# Patient Record
Sex: Female | Born: 1965 | Race: Black or African American | Hispanic: No | Smoking: Former smoker
Health system: Southern US, Community
[De-identification: ages and names within clinical notes are randomized; demographics above are authoritative.]

## PROBLEM LIST (undated history)

## (undated) DIAGNOSIS — I10 Essential (primary) hypertension: Secondary | ICD-10-CM

## (undated) DIAGNOSIS — N183 Chronic kidney disease, stage 3 unspecified: Secondary | ICD-10-CM

---

## 2004-04-18 ENCOUNTER — Emergency Department: Payer: Self-pay | Admitting: Emergency Medicine

## 2007-02-26 ENCOUNTER — Other Ambulatory Visit: Payer: Self-pay

## 2007-02-26 ENCOUNTER — Emergency Department: Payer: Self-pay | Admitting: Internal Medicine

## 2008-08-08 ENCOUNTER — Ambulatory Visit: Payer: Self-pay | Admitting: Family Medicine

## 2008-12-15 ENCOUNTER — Ambulatory Visit: Payer: Self-pay | Admitting: Family Medicine

## 2009-11-16 ENCOUNTER — Ambulatory Visit: Payer: Self-pay | Admitting: Family Medicine

## 2011-02-05 ENCOUNTER — Ambulatory Visit: Payer: Self-pay | Admitting: Family Medicine

## 2012-05-21 ENCOUNTER — Ambulatory Visit: Payer: Self-pay | Admitting: Family Medicine

## 2013-10-18 ENCOUNTER — Ambulatory Visit: Payer: Self-pay | Admitting: Family Medicine

## 2015-10-09 ENCOUNTER — Other Ambulatory Visit: Payer: Self-pay | Admitting: Family Medicine

## 2015-10-09 DIAGNOSIS — Z1231 Encounter for screening mammogram for malignant neoplasm of breast: Secondary | ICD-10-CM

## 2015-10-25 ENCOUNTER — Ambulatory Visit
Admission: RE | Admit: 2015-10-25 | Discharge: 2015-10-25 | Disposition: A | Discharge status: Home / Self Care | Payer: Medicaid Other | Source: Ambulatory Visit | Attending: Family Medicine | Admitting: Family Medicine

## 2015-10-25 DIAGNOSIS — Z1231 Encounter for screening mammogram for malignant neoplasm of breast: Secondary | ICD-10-CM

## 2017-01-29 ENCOUNTER — Other Ambulatory Visit: Payer: Self-pay | Admitting: Family Medicine

## 2017-01-29 DIAGNOSIS — Z1239 Encounter for other screening for malignant neoplasm of breast: Secondary | ICD-10-CM

## 2017-02-24 ENCOUNTER — Ambulatory Visit
Admission: RE | Admit: 2017-02-24 | Discharge: 2017-02-24 | Disposition: A | Discharge status: Home / Self Care | Payer: Medicaid Other | Source: Ambulatory Visit | Attending: Family Medicine | Admitting: Family Medicine

## 2017-02-24 DIAGNOSIS — Z1231 Encounter for screening mammogram for malignant neoplasm of breast: Secondary | ICD-10-CM | POA: Insufficient documentation

## 2017-02-24 DIAGNOSIS — Z1239 Encounter for other screening for malignant neoplasm of breast: Secondary | ICD-10-CM

## 2018-09-20 ENCOUNTER — Emergency Department
Admission: EM | Admit: 2018-09-20 | Discharge: 2018-09-20 | Disposition: A | Discharge status: Home / Self Care | Payer: Medicaid Other | Attending: Emergency Medicine | Admitting: Emergency Medicine

## 2018-09-20 ENCOUNTER — Emergency Department: Payer: Medicaid Other

## 2018-09-20 ENCOUNTER — Other Ambulatory Visit: Payer: Self-pay

## 2018-09-20 DIAGNOSIS — I129 Hypertensive chronic kidney disease with stage 1 through stage 4 chronic kidney disease, or unspecified chronic kidney disease: Secondary | ICD-10-CM | POA: Insufficient documentation

## 2018-09-20 DIAGNOSIS — W01198A Fall on same level from slipping, tripping and stumbling with subsequent striking against other object, initial encounter: Secondary | ICD-10-CM | POA: Insufficient documentation

## 2018-09-20 DIAGNOSIS — Y929 Unspecified place or not applicable: Secondary | ICD-10-CM | POA: Insufficient documentation

## 2018-09-20 DIAGNOSIS — Y999 Unspecified external cause status: Secondary | ICD-10-CM | POA: Insufficient documentation

## 2018-09-20 DIAGNOSIS — Y9389 Activity, other specified: Secondary | ICD-10-CM | POA: Insufficient documentation

## 2018-09-20 DIAGNOSIS — S4992XA Unspecified injury of left shoulder and upper arm, initial encounter: Secondary | ICD-10-CM | POA: Diagnosis present

## 2018-09-20 DIAGNOSIS — R262 Difficulty in walking, not elsewhere classified: Secondary | ICD-10-CM | POA: Diagnosis not present

## 2018-09-20 DIAGNOSIS — N183 Chronic kidney disease, stage 3 (moderate): Secondary | ICD-10-CM | POA: Insufficient documentation

## 2018-09-20 DIAGNOSIS — W19XXXA Unspecified fall, initial encounter: Secondary | ICD-10-CM

## 2018-09-20 DIAGNOSIS — S40012A Contusion of left shoulder, initial encounter: Secondary | ICD-10-CM | POA: Insufficient documentation

## 2018-09-20 HISTORY — DX: Essential (primary) hypertension: I10

## 2018-09-20 HISTORY — DX: Chronic kidney disease, stage 3 unspecified: N18.30

## 2018-09-20 MED ORDER — ACETAMINOPHEN 500 MG PO TABS
1000.0000 mg | ORAL_TABLET | Freq: Once | ORAL | Status: AC
  Administered 2018-09-20: 1000 mg via ORAL
  Filled 2018-09-20: qty 2

## 2018-09-20 NOTE — ED Notes (Signed)
Pt may eat and drink per EDP. Pt given saltines and soda.

## 2018-09-20 NOTE — ED Triage Notes (Signed)
Pt states she fell trying to get to walker, walker slipped away and pt fell. Pt was trying to get up and pulled something in shoulder. Pt uses wheelchair. Pt denies LOC, lightheadedness. Pt denies hitting head.

## 2018-09-20 NOTE — Discharge Instructions (Signed)
You should be contacted by a home health agency by the end of the week.  In the meantime, attempt to minimize falls by using your walker and wheelchair and ask your son for assistance when needed.  Please follow-up with your primary care provider and return to the ED for new or worsening symptoms.

## 2018-09-20 NOTE — ED Provider Notes (Signed)
[facility] Emergency Department Provider Note   ____________________________________________   First MD Initiated Contact with Patient 09/20/18 1204     (approximate)  I have reviewed the triage vital signs and the nursing notes.   HISTORY  Chief Complaint Fall    HPI Karen Ray is a 53 y.o. female 53 year old female with past medical history of hypertension presents to the ED complaining of a fall.  Patient reports that she was attempting to transition from a seated position to her Hoveround wheelchair using the assistance of her walker, when the walker slipped and she fell to the ground.  She reports falling onto her left shoulder but denies hitting her head or losing consciousness.  She now complains of pain in her left shoulder but denies pain anywhere else.  Range of motion in her left shoulder is limited secondary to pain, but she is able to range her left elbow and left wrist without difficulty.  She states she lives alone and has only occasional help from her son at home.  She expresses concerns about her safety at home and ability to care for herself.        Past Medical History:  Diagnosis Date  . Hypertension   . Kidney disease, chronic, stage III (moderate, EGFR 30-59 ml/min) (HCC)     There are no active problems to display for this patient.   No past surgical history on file.  Prior to Admission medications   Not on File    Allergies Patient has no allergy information on record.  Family History  Problem Relation Age of Onset  . Breast cancer Mother     Social History Social History   Tobacco Use  . Smoking status: Not on file  Substance Use Topics  . Alcohol use: Not on file  . Drug use: Not on file    Review of Systems  Constitutional: No fever/chills Eyes: No visual changes. ENT: No sore throat. Cardiovascular: Denies chest pain. Respiratory: Denies shortness of breath. Gastrointestinal: No abdominal  pain.  No nausea, no vomiting.  No diarrhea.  No constipation. Genitourinary: Negative for dysuria. Musculoskeletal: Negative for back pain.  Positive for fall and left shoulder pain. Skin: Negative for rash. Neurological: Negative for headaches, focal weakness or numbness.  ____________________________________________   PHYSICAL EXAM:  VITAL SIGNS: ED Triage Vitals  Enc Vitals Group     BP 09/20/18 1109 (!) 154/81     Pulse Rate 09/20/18 1109 98     Resp 09/20/18 1109 18     Temp 09/20/18 1109 98.5 F (36.9 C)     Temp Source 09/20/18 1109 Oral     SpO2 09/20/18 1109 98 %     Weight 09/20/18 1111 220 lb (99.8 kg)     Height 09/20/18 1111 '5\' 3"'$  (1.6 m)     Head Circumference --      Peak Flow --      Pain Score 09/20/18 1110 10     Pain Loc --      Pain Edu? --      Excl. in [place]? --     Constitutional: Alert and oriented. Eyes: Conjunctivae are normal. Head: Atraumatic. Nose: No congestion/rhinnorhea. Mouth/Throat: Mucous membranes are moist. Neck: Normal ROM Cardiovascular: Normal rate, regular rhythm. Grossly normal heart sounds. Respiratory: Normal respiratory effort.  No retractions. Lungs CTAB. Gastrointestinal: Soft and nontender. No distention. Genitourinary: deferred Musculoskeletal: No lower extremity tenderness nor edema.  Diffuse tenderness to left lateral shoulder with range  of motion limited secondary to pain.  No bony tenderness along elbow or wrist, range of motion intact at these joints. Neurologic:  Normal speech and language. No gross focal neurologic deficits are appreciated. Skin:  Skin is warm, dry and intact. No rash noted. Psychiatric: Mood and affect are normal. Speech and behavior are normal.  ____________________________________________   LABS (all labs ordered are listed, but only abnormal results are displayed)  Labs Reviewed - No data to display   PROCEDURES  Procedure(s) performed (including Critical Care):  Procedures    ____________________________________________   INITIAL IMPRESSION / ASSESSMENT AND PLAN / ED COURSE       53 year old female presents to the ED after mechanical fall at home, striking her left shoulder.  She is neurovascularly intact to her left upper extremity and plain films negative for acute process.  No other apparent traumatic injuries, patient placed in shoulder immobilizer for comfort.  There is concerned that patient may not be able to care for herself at home, will discuss with care management.  Care management has arranged for patient to receive home health care hopefully starting later this week.  Patient is agreeable to this, has a safe ride home and her son is available for help as needed.      ____________________________________________   FINAL CLINICAL IMPRESSION(S) / ED DIAGNOSES  Final diagnoses:  Fall, initial encounter  Contusion of left shoulder, initial encounter  Difficulty walking     ED Discharge Orders    None       Note:  This document was prepared using Dragon voice recognition software and may include unintentional dictation errors.   Blake Divine, MD 09/20/18 2156

## 2018-09-20 NOTE — Social Work (Signed)
Patient currently only has Medicaid. CSW will try to contact home health agencies to see if they will be able to accept her for PT and nurse aide.   [place], [place] ED  [phone]

## 2018-09-20 NOTE — TOC Progression Note (Signed)
Transition of Care [facility]) - Progression Note    Patient Details  Name: Karen Ray MRN: [id number] Date of Birth: 1965/04/07  [facility] CM/SW Contact  Tania Walterine Amodei, LCSW Phone Number: 09/20/2018, 2:38 PM  Clinical Narrative:     Patient was also provided with resources for the PACE program.    Expected Discharge Plan: [place] Barriers to Discharge: Continued Medical Work up  Expected Discharge Plan and Services Expected Discharge Plan: [place]   Discharge Planning Services: CM Consult   Living arrangements for the past 2 months: Apartment(living community for elderly)                           HH Arranged: PT, Nurse's Aide [place] Agency: [facility] (now [facility]) Date [place]: 09/27/18 Time [place]: 1400 Representative spoke with at [place]: [place] ([place]) Interventions    Readmission Risk Interventions No flowsheet data found.

## 2018-09-20 NOTE — TOC Initial Note (Signed)
Transition of Care [facility]) - Initial/Assessment Note    Patient Details  Name: Karen Ray MRN: [id number] Date of Birth: 1966-01-30  Transition of Care [facility]) CM/SW Contact:    Fredric Mare, LCSW Phone Number: 09/20/2018, 2:20 PM  Clinical Narrative:                   Expected Discharge Plan: [place] Barriers to Discharge: Continued Medical Work up   Patient Goals and CMS Choice  Patient is a 53 year old female that presents to the ED for a fall. Patient stated that she reached for her rolling walker that moved, and she fell afterwards. Patient currently lives alone, however states that her son comes to check on her from time to time. Patient states that she also has a wheelchair at home.  Patient reports that her legs "have gotten stiff." Patient would like home health services.  CSW contacted Delton Coombes at [facility], and she stated that it may be the end of the week until they can come and see patient.  CSW asked EDP to order face to face and home health for PT and nurse's aide.           Expected Discharge Plan and Services Expected Discharge Plan: [place]   Discharge Planning Services: CM Consult   Living arrangements for the past 2 months: Apartment(living community for elderly)                           HH Arranged: PT, Nurse's Aide HH Agency: [facility]) Date [place]: 09/27/18 Time [place]: 1400 Representative spoke with at [place]: [place] Arrangements/Services Living arrangements for the past 2 months: Apartment(living community for elderly) Lives with:: Self(has son that comes to check with her from time to time) Patient language and need for interpreter reviewed:: Yes Do you feel safe going back to the place where you live?: Yes      Need for Family Participation in Patient Care: No (Comment) Care giver support system in place?: No  (comment) Current home services: DME(patient has walker and wheelchair) Criminal Activity/Legal Involvement Pertinent to Current Situation/Hospitalization: No - Comment as needed  Activities of Daily Living      Permission Sought/Granted                  Emotional Assessment Appearance:: Appears stated age Attitude/Demeanor/Rapport: Engaged Affect (typically observed): Hopeless Orientation: : Oriented to Self, Oriented to Place, Oriented to  Time, Oriented to Situation Alcohol / Substance Use: Never Used Psych Involvement: No (comment)  Admission diagnosis:  arm pain There are no active problems to display for this patient.  PCP:  Marguerita Merles, MD Pharmacy:  No Pharmacies Listed    Social Determinants of Health (SDOH) Interventions    Readmission Risk Interventions No flowsheet data found.

## 2018-11-08 ENCOUNTER — Other Ambulatory Visit: Payer: Self-pay | Admitting: Family Medicine

## 2018-11-08 DIAGNOSIS — Z1231 Encounter for screening mammogram for malignant neoplasm of breast: Secondary | ICD-10-CM

## 2019-03-15 ENCOUNTER — Other Ambulatory Visit: Payer: Self-pay | Admitting: Neurology

## 2019-03-15 ENCOUNTER — Other Ambulatory Visit (HOSPITAL_COMMUNITY): Payer: Self-pay | Admitting: Neurology

## 2019-03-15 DIAGNOSIS — R0989 Other specified symptoms and signs involving the circulatory and respiratory systems: Secondary | ICD-10-CM

## 2019-03-25 ENCOUNTER — Ambulatory Visit
Admission: RE | Admit: 2019-03-25 | Discharge: 2019-03-25 | Disposition: A | Discharge status: Home / Self Care | Payer: Medicaid Other | Source: Ambulatory Visit | Attending: Neurology | Admitting: Neurology

## 2019-03-25 ENCOUNTER — Other Ambulatory Visit: Payer: Self-pay

## 2019-03-25 DIAGNOSIS — R0989 Other specified symptoms and signs involving the circulatory and respiratory systems: Secondary | ICD-10-CM | POA: Diagnosis not present

## 2019-04-27 ENCOUNTER — Ambulatory Visit: Payer: Medicaid Other | Admitting: Physical Therapy

## 2019-08-04 ENCOUNTER — Other Ambulatory Visit: Payer: Self-pay

## 2019-08-04 ENCOUNTER — Ambulatory Visit: Payer: Medicaid Other | Attending: Neurology | Admitting: Physical Therapy

## 2019-08-04 ENCOUNTER — Encounter: Payer: Self-pay | Admitting: Physical Therapy

## 2019-08-04 DIAGNOSIS — R2689 Other abnormalities of gait and mobility: Secondary | ICD-10-CM | POA: Diagnosis present

## 2019-08-04 DIAGNOSIS — R293 Abnormal posture: Secondary | ICD-10-CM | POA: Insufficient documentation

## 2019-08-04 DIAGNOSIS — R262 Difficulty in walking, not elsewhere classified: Secondary | ICD-10-CM | POA: Diagnosis present

## 2019-08-04 DIAGNOSIS — M6281 Muscle weakness (generalized): Secondary | ICD-10-CM

## 2019-08-04 NOTE — Patient Instructions (Signed)
Access Code: [email or web address]: [email or web address]: 07/01/2021Prepared by: Casimiro Needle SherkExercises  Seated March - 1 x daily - 7 x weekly - 3 sets - 10 reps  Seated Hip Abduction - 1 x daily - 7 x weekly - 3 sets - 10 reps  Seated Hip Adduction Squeeze with Ball - 1 x daily - 7 x weekly - 3 sets - 10 reps

## 2019-08-04 NOTE — Therapy (Signed)
[facility] [address]. [place], [place], [address] Phone: [phone]   Fax:  [phone]  Physical Therapy Evaluation  Patient Details  Name: Karen Ray MRN: [id number] Date of Birth: May 26, 1965 Referring Provider (PT): Dr. Margart Sickles   Encounter Date: 08/04/2019   PT End of Session - 08/04/19 1021    Visit Number 1    Number of Visits 9    Date for PT Re-Evaluation 09/01/19    Authorization - Visit Number 1    Authorization - Number of Visits 10    PT Start Time 0804    PT Stop Time 2542    PT Time Calculation (min) 54 min    Equipment Utilized During Treatment Gait belt;Other (comment)   walker   Activity Tolerance Patient tolerated treatment well;Patient limited by fatigue    Behavior During Therapy WFL for tasks assessed/performed           Past Medical History:  Diagnosis Date   Hypertension    Kidney disease, chronic, stage III (moderate, EGFR 30-59 ml/min)     History reviewed. No pertinent surgical history.  There were no vitals filed for this visit.    Subjective Assessment - 08/04/19 0814    Subjective Pt. reports having an AFO, lift chair, and presents in a power chair.  Pt. is a 54 y.o woman presenting to therapy in a powerchair with R AFO. Pt. has pain in left upper thigh, reports that it feels sore (6/10). Pt. reports using IcyHot and gabapentin to manage pain, however gabapentin makes her sleepy. IcyHot and gabapentin alleviates pain. Pt. reports not having walked in a long time, and not being able to walk due to her "feet not working at all." Pt. reports falling a month ago after trying to go to the bathroom when trying to do a stand pivot transfer from bed to power chair, explains "legs just gave out." Pt. reports that she was given an AFO for R leg shortly after. Pt. reports her R leg has stopped working about 2 months ago. Pt. has an aide that helps during the daytime with her ADLs and  self care tasks. Pt. unable to get in shower due to weakness, is only able to take sink baths.    Limitations Standing;Walking    Patient Stated Goals wants to return to walking, reduce pain in leg.    Currently in Pain? Yes    Pain Score 6     Pain Location Leg    Pain Orientation Left    Pain Descriptors / Indicators Aching    Pain Relieving Factors gabapentin, IcyHot              [facility] PT Assessment - 08/04/19 0001      Assessment   Medical Diagnosis Difficulty walking    Referring Provider (PT) Dr. Alroy Dust Potter,MD    Onset Date/Surgical Date 02/04/19      Balance Screen   Has the patient fallen in the past 6 months Yes    How many times? 1+    Has the patient had a decrease in activity level because of a fear of falling?  Yes    Is the patient reluctant to leave their home because of a fear of falling?  Yes      [place] residence    Living Arrangements Alone    Type of [place] Access Level entry  Home Layout One level      Prior Function   Level of Independence Needs assistance with ADLs;Needs assistance with homemaking;Needs assistance with transfers      Cognition   Overall Cognitive Status Within Functional Limits for tasks assessed             Strength: Pt. unable to complete full AROM of bilat. LE against gravity. There are palpable muscle contractions on L LE, less noticeable on R LE.   Posture: Pt. presents in power chair with slumped posture with sacral sitting.   Transfers: Pt. unable to complete stand pivot sit transfers without mod assist x2 and rolling walker. Pt. able to move left leg by using momentum and shifting her weight to the right. Pt. not able to use right leg during transfer, required PT assist to move.   Sit to Stand: Pt. completed 5 STS, required cueing to not pull up on walker to stand and using hand on mat to push. Pt. also required cueing to lean forward to stand. During descent,  pt. required cueing to reach hand back to control the motion. Pt. required mod assist x2 to complete      Objective measurements completed on examination: See above findings.      See HEP     PT Education - 08/04/19 0903    Education Details See HEP    Person(s) Educated Patient    Methods Explanation;Demonstration;Handout    Comprehension Verbalized understanding;Returned demonstration               PT Long Term Goals - 08/04/19 1210      PT LONG TERM GOAL #1   Title Pt. will complete stand pivot sit transfer with least restrictive assistive device independently to improve functional mobility    Baseline Pt. unable to complete without walker and requires mod assist x2    Time 4    Period Weeks    Status New    Target Date 09/01/19      PT LONG TERM GOAL #2   Title Pt. will increase bilat. LE strength to 3/5 for hip flex, abd, and add, as well as knee flex and ext to improve gait.    Baseline Pt. unable to complete full ROM in bilat LE    Time 4    Period Weeks    Status New    Target Date 09/01/19      PT LONG TERM GOAL #3   Title Pt. will be able to ambulate 10 feet with least restrictive assistive device to access bathroom at home to improve functional mobility.    Baseline Pt. unable to ambulate    Time 4    Period Weeks    Status New    Target Date 09/01/19      PT LONG TERM GOAL #4   Title Pt. will improve FOTO score to 65 to improve pain free mobility.    Baseline Pt. scored 47 on 08/04/19    Time 4    Period Weeks    Status New    Target Date 09/01/19              Plan - 08/04/19 1023    Clinical Impression Statement Pt. is a 54 y/o female and reports to therapy in power chair, R AFO with double supports, and is currently wearing a diaper. In powerchair, pt. demonstrates slumped posture with bilateral hip abd. Pt. unable to complete full AROM of bilateral legs, and demonstrates substantial weakness in  bilateral LE. Pt. demonstrates greater  ability to activate L LE muslces compared to R. Pt. is able to complete stand pivot transfer with Mod assist x2 demonstrating extreme difficulty activating and using R leg. Pt. unable to scoot backwards on mat with proper mechanics, pt. must lean backwards and scoot to complete. Pt. completed 5x STS from high surface with mod assist x2, required verbal and tactile cueing to not pull on walker to standing. Pt. able to stand for about 1-2 mins at a time limited by fatigue in legs. Pt. unable to complete seated hip flex, abd, or add. Pt. will benefit from skilled PT to address global bilateral LE weakness and improve balance.    Personal Factors and Comorbidities Transportation    Examination-Activity Limitations Bathing;Bed Mobility;Dressing;Hygiene/Grooming;Lift;Squat;Stairs;Stand;Transfers    Stability/Clinical Decision Making Unstable/Unpredictable    Clinical Decision Making High    Rehab Potential Fair    PT Frequency 2x / week    PT Duration 4 weeks    PT Treatment/Interventions ADLs/Self Care Home Management;Electrical Stimulation;Gait training;Therapeutic activities;Functional mobility training;Stair training;Therapeutic exercise;Balance training;Neuromuscular re-education;Patient/family education;Orthotic Fit/Training;Passive range of motion;Manual techniques    PT Next Visit Plan Transfer training.  Progress HEP (supine based ex.).    PT Home Exercise Plan [email or web address]    Consulted and Agree with Plan of Care Patient           Patient will benefit from skilled therapeutic intervention in order to improve the following deficits and impairments:  Abnormal gait, Decreased endurance, Decreased activity tolerance, Decreased balance, Decreased coordination, Difficulty walking, Postural dysfunction, Decreased strength, Decreased mobility, Improper body mechanics, Decreased range of motion  Visit Diagnosis: Difficulty in walking, not elsewhere classified  Muscle weakness (generalized)  Abnormal  posture  Imbalance     Problem List There are no problems to display for this patient.  Pura Spice, PT, DPT # [id number], SPT 08/04/2019, 1:08 PM  [facility] [address] [place], [place], [address] Phone: [phone]   Fax:  [phone]  Name: SHANYCE DARIS MRN: [id number] Date of Birth: [birth year]

## 2019-08-09 ENCOUNTER — Ambulatory Visit: Payer: Medicaid Other | Admitting: Physical Therapy

## 2019-08-11 ENCOUNTER — Ambulatory Visit: Payer: Medicaid Other | Admitting: Physical Therapy

## 2019-08-15 ENCOUNTER — Ambulatory Visit: Payer: Medicaid Other | Admitting: Physical Therapy

## 2019-08-16 ENCOUNTER — Ambulatory Visit: Payer: Medicaid Other | Admitting: Physical Therapy

## 2019-08-16 ENCOUNTER — Other Ambulatory Visit: Payer: Self-pay

## 2019-08-16 ENCOUNTER — Encounter: Payer: Self-pay | Admitting: Physical Therapy

## 2019-08-16 DIAGNOSIS — R293 Abnormal posture: Secondary | ICD-10-CM

## 2019-08-16 DIAGNOSIS — R262 Difficulty in walking, not elsewhere classified: Secondary | ICD-10-CM | POA: Diagnosis not present

## 2019-08-16 DIAGNOSIS — M6281 Muscle weakness (generalized): Secondary | ICD-10-CM

## 2019-08-16 DIAGNOSIS — R2689 Other abnormalities of gait and mobility: Secondary | ICD-10-CM

## 2019-08-16 NOTE — Therapy (Signed)
[place] [facility] [address] [place], [place], [address] Phone: [phone]   Fax:  [phone]  Physical Therapy Treatment  Patient Details  Name: Karen Ray MRN: [id number] Date of Birth: 1965-03-24 Referring Provider (PT): Dr. Margart Sickles   Encounter Date: 08/16/2019   PT End of Session - 08/16/19 1313    Visit Number 2    Number of Visits 9    Date for PT Re-Evaluation 09/01/19    Authorization - Visit Number 2    Authorization - Number of Visits 10    PT Start Time 2725    PT Stop Time 1206    PT Time Calculation (min) 53 min    Equipment Utilized During Treatment Gait belt;Other (comment)   walker   Activity Tolerance Patient tolerated treatment well;Patient limited by fatigue    Behavior During Therapy WFL for tasks assessed/performed           Past Medical History:  Diagnosis Date   Hypertension    Kidney disease, chronic, stage III (moderate, EGFR 30-59 ml/min)     History reviewed. No pertinent surgical history.  There were no vitals filed for this visit.   Subjective Assessment - 08/16/19 1311    Subjective Pt. reports she attempted to complete HEP at home, but was unable in sitting. Pt. states she had some success when supine. Pt. mentions her toenail is bothering her on her R foot and that it hurts to have shoes on today.    Limitations Standing;Walking    Patient Stated Goals wants to return to walking, reduce pain in leg.    Currently in Pain? Other (Comment)   pt could not give rating           There ex:  Supine on blue mat with red bolster under knees:  Knee ext: 10x each leg, requires assist to complete motion    Glute sets 10x, 5x with 5 sec hold, 5x with 3 sec hold  Dorsiflex/plantar flex: pt attempted to complete, however no muscle activation was felt in either tib ant or R gastroc, some activation was found in L gastroc. PT assessed clonus in bilat ankles, worse on L than  R.  Neuro Re-ed:  Scooting on mat: initially instructed on head hips principle, cueing for "headbut me" helped pt lean forward. Pt. then instructed to maintain forward trunk and put weight into hands on edge of mat. Pt. instructed in lift-shift-lower technique for scooting. Pt able to complete scoots in this manner inconsistently with mod verbal cueing.   Stand pivot sit transfers: Pt. unable to manage legs consistently, requiring mod-max assist. Pt. Required mod-max assistx2 to complete transfer back from mat to chair.         PT Long Term Goals - 08/04/19 1210      PT LONG TERM GOAL #1   Title Pt. will complete stand pivot sit transfer with least restrictive assistive device independently to improve functional mobility    Baseline Pt. unable to complete without walker and requires mod assist x2    Time 4    Period Weeks    Status New    Target Date 09/01/19      PT LONG TERM GOAL #2   Title Pt. will increase bilat. LE strength to 3/5 for hip flex, abd, and add, as well as knee flex and ext to improve gait.    Baseline Pt. unable to complete full ROM in bilat LE    Time  4    Period Weeks    Status New    Target Date 09/01/19      PT LONG TERM GOAL #3   Title Pt. will be able to ambulate 10 feet with least restrictive assistive device to access bathroom at home to improve functional mobility.    Baseline Pt. unable to ambulate    Time 4    Period Weeks    Status New    Target Date 09/01/19      PT LONG TERM GOAL #4   Title Pt. will improve FOTO score to 65 to improve pain free mobility.    Baseline Pt. scored 47 on 08/04/19    Time 4    Period Weeks    Status New    Target Date 09/01/19                 Plan - 08/16/19 1316    Clinical Impression Statement Pt. improved standing ability today to standing for 1 min with mod amount of UE support from walker, with mod assistx2 to come to standing. Pt. completed supine LE strengthening activities today, no muslce  activation noted in bilat hip abductors, bilat ant tibs, and R gastroc. Grade 1/5 strength was noted in R quad, grade 2-/5 strength in L quad. PT assessed bilat clonus in ankles. Pt. instructed in head/hips principle and lift-shift-lower principle to scoot along the mat. With mod-max verbal cueing, pt able to complete scooting to the right with CGA, however requires max A to manage legs during scooting. Pt. completed 2x stand pivot transfers with mod-max assistx2, pt. unable to manage legs independently. Pt. would benefit from OT to assist with ADLs such as dressing and bathing. Pt. will continue to benefit from skilled PT to improve global strength and improve transfer ability.    Personal Factors and Comorbidities Transportation    Examination-Activity Limitations Bathing;Bed Mobility;Dressing;Hygiene/Grooming;Lift;Squat;Stairs;Stand;Transfers    Stability/Clinical Decision Making Unstable/Unpredictable    Clinical Decision Making High    Rehab Potential Fair    PT Frequency 2x / week    PT Duration 4 weeks    PT Treatment/Interventions ADLs/Self Care Home Management;Electrical Stimulation;Gait training;Therapeutic activities;Functional mobility training;Stair training;Therapeutic exercise;Balance training;Neuromuscular re-education;Patient/family education;Orthotic Fit/Training;Passive range of motion;Manual techniques    PT Next Visit Plan Transfer training.  Progress HEP (supine based ex.).    PT Home Exercise Plan [id number]    Consulted and Agree with Plan of Care Patient           Patient will benefit from skilled therapeutic intervention in order to improve the following deficits and impairments:  Abnormal gait, Decreased endurance, Decreased activity tolerance, Decreased balance, Decreased coordination, Difficulty walking, Postural dysfunction, Decreased strength, Decreased mobility, Improper body mechanics, Decreased range of motion  Visit Diagnosis: Difficulty in walking, not elsewhere  classified  Muscle weakness (generalized)  Abnormal posture  Imbalance     Problem List There are no problems to display for this patient.  Pura Spice, PT, DPT # [id number], SPT 08/16/2019, 3:21 PM  [place] [facility] [address] [place], [place], [address] Phone: [phone]   Fax:  [phone]  Name: Karen Ray MRN: [id number] Date of Birth: Jun 10, 1965

## 2019-08-17 ENCOUNTER — Encounter: Payer: Medicaid Other | Admitting: Physical Therapy

## 2019-08-18 ENCOUNTER — Ambulatory Visit: Payer: Medicaid Other | Admitting: Physical Therapy

## 2019-08-22 ENCOUNTER — Ambulatory Visit: Payer: Medicaid Other | Admitting: Physical Therapy

## 2019-08-24 ENCOUNTER — Encounter: Payer: Medicaid Other | Admitting: Physical Therapy

## 2019-08-29 ENCOUNTER — Other Ambulatory Visit: Payer: Self-pay

## 2019-08-29 ENCOUNTER — Encounter: Payer: Self-pay | Admitting: Physical Therapy

## 2019-08-29 ENCOUNTER — Ambulatory Visit: Payer: Medicaid Other | Admitting: Physical Therapy

## 2019-08-29 DIAGNOSIS — M6281 Muscle weakness (generalized): Secondary | ICD-10-CM

## 2019-08-29 DIAGNOSIS — R293 Abnormal posture: Secondary | ICD-10-CM

## 2019-08-29 DIAGNOSIS — R262 Difficulty in walking, not elsewhere classified: Secondary | ICD-10-CM | POA: Diagnosis not present

## 2019-08-29 DIAGNOSIS — R2689 Other abnormalities of gait and mobility: Secondary | ICD-10-CM

## 2019-08-29 NOTE — Therapy (Signed)
[place] [facility] [address] [place], [place], [address] Phone: [phone]   Fax:  [phone]  Physical Therapy Treatment  Patient Details  Name: Karen Ray MRN: [id number] Date of Birth: February 09, 1965 Referring Provider (PT): Dr. Margart Sickles   Encounter Date: 08/29/2019   PT End of Session - 08/29/19 1153    Visit Number 3    Number of Visits 9    Date for PT Re-Evaluation 09/01/19    Authorization - Visit Number 3    Authorization - Number of Visits 10    PT Start Time 4193    PT Stop Time 7902    PT Time Calculation (min) 41 min    Equipment Utilized During Treatment Gait belt;Other (comment)   walker   Activity Tolerance Patient tolerated treatment well;Patient limited by fatigue    Behavior During Therapy [facility] for tasks assessed/performed           Past Medical History:  Diagnosis Date  . Hypertension   . Kidney disease, chronic, stage III (moderate, EGFR 30-59 ml/min)     History reviewed. No pertinent surgical history.  There were no vitals filed for this visit.   Subjective Assessment - 08/29/19 1112    Subjective Pt. reports that she has been able to complete her HEP over the weekend. Pt. got a new seat on her [employer]over[employer]. Pt states that toenail on R foot is still bothering her.    Limitations Standing;Walking    Patient Stated Goals wants to return to walking, reduce pain in leg.    Currently in Pain? No/denies             Neuro re-ed:  STS in // bars: pt able to complete multiple STS in // bars with 2x assist. Pt required mod assist to come to standing and heavy use of bilat UE to come to standing. Pt progressed to standing with unilat UE support on // bar. Pt then progressed to cone reaching during standing. Pt practiced weightshifting with bilat UE support on bars. Pt required cueing to stand upright.          PT Long Term Goals - 08/04/19 1210      PT LONG TERM GOAL #1   Title Pt. will  complete stand pivot sit transfer with least restrictive assistive device independently to improve functional mobility    Baseline Pt. unable to complete without walker and requires mod assist x2    Time 4    Period Weeks    Status New    Target Date 09/01/19      PT LONG TERM GOAL #2   Title Pt. will increase bilat. LE strength to 3/5 for hip flex, abd, and add, as well as knee flex and ext to improve gait.    Baseline Pt. unable to complete full ROM in bilat LE    Time 4    Period Weeks    Status New    Target Date 09/01/19      PT LONG TERM GOAL #3   Title Pt. will be able to ambulate 10 feet with least restrictive assistive device to access bathroom at home to improve functional mobility.    Baseline Pt. unable to ambulate    Time 4    Period Weeks    Status New    Target Date 09/01/19      PT LONG TERM GOAL #4   Title Pt. will improve FOTO score to 65 to improve pain  free mobility.    Baseline Pt. scored 47 on 08/04/19    Time 4    Period Weeks    Status New    Target Date 09/01/19                 Plan - 08/29/19 1216    Clinical Impression Statement Pt. improved standing tolerance in // bars today to about 3 mins per stand. Pt. required mod assist to stand each time with one therapist in front and one behind. Pt. able to practice reaching and standing with only one hand on // bars. Pt. experienced no LOB or falls during therapy. Pt. had no increase in pain throughout session. Pt. will continue to benefit from skilled PT to address global strength deficits and improve safety of transfers. Pt. will additionally benefit from OT to assist with bathing and dressing as well as other ADLs.    Personal Factors and Comorbidities Transportation    Examination-Activity Limitations Bathing;Bed Mobility;Dressing;Hygiene/Grooming;Lift;Squat;Stairs;Stand;Transfers    Stability/Clinical Decision Making Unstable/Unpredictable    Clinical Decision Making High    Rehab Potential Fair      PT Frequency 2x / week    PT Duration 4 weeks    PT Treatment/Interventions ADLs/Self Care Home Management;Electrical Stimulation;Gait training;Therapeutic activities;Functional mobility training;Stair training;Therapeutic exercise;Balance training;Neuromuscular re-education;Patient/family education;Orthotic Fit/Training;Passive range of motion;Manual techniques    PT Next Visit Plan Transfer training.  Progress HEP (supine based ex.).    PT Home Exercise Plan [id number]    Consulted and Agree with Plan of Care Patient           Patient will benefit from skilled therapeutic intervention in order to improve the following deficits and impairments:  Abnormal gait, Decreased endurance, Decreased activity tolerance, Decreased balance, Decreased coordination, Difficulty walking, Postural dysfunction, Decreased strength, Decreased mobility, Improper body mechanics, Decreased range of motion  Visit Diagnosis: Difficulty in walking, not elsewhere classified  Muscle weakness (generalized)  Abnormal posture  Imbalance     Problem List There are no problems to display for this patient.  Pura Spice, PT, DPT # [id number] Carlyle Basques, SPT 08/30/2019, 9:41 AM  [place] [facility] [address] [place], [place], [address] Phone: [phone]   Fax:  [phone]  Name: NITI LEISURE MRN: [id number] Date of Birth: 1965/12/07

## 2019-08-31 ENCOUNTER — Encounter: Payer: Self-pay | Admitting: Physical Therapy

## 2019-08-31 ENCOUNTER — Ambulatory Visit: Payer: Medicaid Other

## 2019-08-31 ENCOUNTER — Other Ambulatory Visit: Payer: Self-pay

## 2019-08-31 DIAGNOSIS — R293 Abnormal posture: Secondary | ICD-10-CM

## 2019-08-31 DIAGNOSIS — R262 Difficulty in walking, not elsewhere classified: Secondary | ICD-10-CM | POA: Diagnosis not present

## 2019-08-31 DIAGNOSIS — M6281 Muscle weakness (generalized): Secondary | ICD-10-CM

## 2019-08-31 DIAGNOSIS — R2689 Other abnormalities of gait and mobility: Secondary | ICD-10-CM

## 2019-08-31 NOTE — Therapy (Signed)
[place] [facility] [address] [place], [place], [address] Phone: [phone]   Fax:  [phone]  Physical Therapy Treatment  Patient Details  Name: Karen Ray MRN: [id number] Date of Birth: 07/08/1965 Referring Provider (PT): Dr. Margart Sickles   Encounter Date: 08/31/2019   PT End of Session - 08/31/19 1228    Visit Number 4    Number of Visits 9    Date for PT Re-Evaluation 09/01/19    Authorization - Visit Number 4    Authorization - Number of Visits 10    PT Start Time 8502    PT Stop Time 7741    PT Time Calculation (min) 58 min    Equipment Utilized During Treatment Gait belt;Other (comment)   walker   Activity Tolerance Patient tolerated treatment well;Patient limited by fatigue    Behavior During Therapy [facility] for tasks assessed/performed           Past Medical History:  Diagnosis Date  . Hypertension   . Kidney disease, chronic, stage III (moderate, EGFR 30-59 ml/min)     History reviewed. No pertinent surgical history.  There were no vitals filed for this visit.   Subjective Assessment - 08/31/19 1226    Subjective Pt reports that she was very tired after last session. Pt. has an appointment with Dr. Melrose Nakayama tomororw (7/29).    Limitations Standing;Walking    Patient Stated Goals wants to return to walking, reduce pain in leg.    Currently in Pain? No/denies             Neuro re-ed:   Transfer training: pt able to complete transfer to mat and transfer back to wheelchair with 2x assist from therapists. Pt required min-mod assist to complete transfers, continually demonstrating posterior lean to complete. Pt needs cueing for hand placement during transfers.    Standing in // bars: Pt completed 4 sit to stands from her wheelchair, durations from 1-2 minutes each. Pt cued on maintaining relaxed shoulders and standing upright. Pt required one therapist in front blocking knees and other therapist behind  guiding descent.     30 min 1:1 billable tx time today      PT Long Term Goals - 08/04/19 1210      PT LONG TERM GOAL #1   Title Pt. will complete stand pivot sit transfer with least restrictive assistive device independently to improve functional mobility    Baseline Pt. unable to complete without walker and requires mod assist x2    Time 4    Period Weeks    Status New    Target Date 09/01/19      PT LONG TERM GOAL #2   Title Pt. will increase bilat. LE strength to 3/5 for hip flex, abd, and add, as well as knee flex and ext to improve gait.    Baseline Pt. unable to complete full ROM in bilat LE    Time 4    Period Weeks    Status New    Target Date 09/01/19      PT LONG TERM GOAL #3   Title Pt. will be able to ambulate 10 feet with least restrictive assistive device to access bathroom at home to improve functional mobility.    Baseline Pt. unable to ambulate    Time 4    Period Weeks    Status New    Target Date 09/01/19      PT LONG TERM GOAL #4   Title  Pt. will improve FOTO score to 65 to improve pain free mobility.    Baseline Pt. scored 47 on 08/04/19    Time 4    Period Weeks    Status New    Target Date 09/01/19                 Plan - 08/31/19 1229    Clinical Impression Statement Pt. practiced slide board transfers today. Pt. demonstrated poor carryover with head/hips principle for completing transfers, continues to demonstrate posterior lean during transfer. Pt. required mod-max assist to complete transfers from 2 therapists. Pt. required cueing for hand placement to complete transfers. Pt. practiced standing in // bars for up to 2 minutes at a time. Pt. will continue to benefit from skilled PT to address global strength deficits and improve safety to decrease falls risk. Pt. will additionally benefit from OT to assist with completing ADLs such as bathing and dressing. Pt. would benefit from social services to assess safety of home environment.     Personal Factors and Comorbidities Transportation    Examination-Activity Limitations Bathing;Bed Mobility;Dressing;Hygiene/Grooming;Lift;Squat;Stairs;Stand;Transfers    Stability/Clinical Decision Making Unstable/Unpredictable    Clinical Decision Making High    Rehab Potential Fair    PT Frequency 2x / week    PT Duration 4 weeks    PT Treatment/Interventions ADLs/Self Care Home Management;Electrical Stimulation;Gait training;Therapeutic activities;Functional mobility training;Stair training;Therapeutic exercise;Balance training;Neuromuscular re-education;Patient/family education;Orthotic Fit/Training;Passive range of motion;Manual techniques    PT Next Visit Plan Transfer training.  Progress HEP (supine based ex.).    PT Home Exercise Plan [id number]    Consulted and Agree with Plan of Care Patient           Patient will benefit from skilled therapeutic intervention in order to improve the following deficits and impairments:  Abnormal gait, Decreased endurance, Decreased activity tolerance, Decreased balance, Decreased coordination, Difficulty walking, Postural dysfunction, Decreased strength, Decreased mobility, Improper body mechanics, Decreased range of motion  Visit Diagnosis: Muscle weakness (generalized)  Imbalance  Abnormal posture  Difficulty in walking, not elsewhere classified     Problem List There are no problems to display for this patient.   Carlyle Basques, SPT 08/31/2019, 12:43 PM Merdis Delay, PT, DPT Physical Therapist -     [facility] [address] [place], [place], [address] Phone: [phone]   Fax:  [phone]  Name: Karen Ray MRN: [id number] Date of Birth: December 03, 1965

## 2019-09-05 ENCOUNTER — Ambulatory Visit: Payer: Medicaid Other | Admitting: Physical Therapy

## 2019-09-07 ENCOUNTER — Ambulatory Visit: Payer: Medicaid Other | Admitting: Physical Therapy

## 2019-09-12 ENCOUNTER — Encounter: Payer: Self-pay | Admitting: Physical Therapy

## 2019-09-12 ENCOUNTER — Ambulatory Visit: Payer: Medicaid Other | Attending: Neurology | Admitting: Physical Therapy

## 2019-09-12 ENCOUNTER — Other Ambulatory Visit: Payer: Self-pay

## 2019-09-12 DIAGNOSIS — R293 Abnormal posture: Secondary | ICD-10-CM | POA: Insufficient documentation

## 2019-09-12 DIAGNOSIS — M6281 Muscle weakness (generalized): Secondary | ICD-10-CM

## 2019-09-12 DIAGNOSIS — R2689 Other abnormalities of gait and mobility: Secondary | ICD-10-CM

## 2019-09-12 DIAGNOSIS — R262 Difficulty in walking, not elsewhere classified: Secondary | ICD-10-CM | POA: Diagnosis present

## 2019-09-12 DIAGNOSIS — R278 Other lack of coordination: Secondary | ICD-10-CM | POA: Insufficient documentation

## 2019-09-12 NOTE — Therapy (Signed)
[place] [facility] [address] [place], [place], [address] Phone: [phone]   Fax:  [phone]  Physical Therapy Treatment  Patient Details  Name: Karen Ray MRN: [id number] Date of Birth: 04-18-1965 Referring Provider (PT): Dr. Margart Sickles   Encounter Date: 09/12/2019   PT End of Session - 09/12/19 1122    Visit Number 5    Number of Visits 13    Date for PT Re-Evaluation 10/10/19    Authorization - Visit Number 1    Authorization - Number of Visits 10    PT Start Time 2426    PT Stop Time 8341    PT Time Calculation (min) 42 min    Equipment Utilized During Treatment Gait belt;Other (comment)   walker   Activity Tolerance Patient tolerated treatment well;Patient limited by fatigue    Behavior During Therapy [facility] for tasks assessed/performed           Past Medical History:  Diagnosis Date  . Hypertension   . Kidney disease, chronic, stage III (moderate, EGFR 30-59 ml/min)     History reviewed. No pertinent surgical history.  There were no vitals filed for this visit.   Subjective Assessment - 09/12/19 1121    Subjective Pt. reports that she had her ingrown toenails cut last week. She feels much better.    Limitations Standing;Walking    Patient Stated Goals wants to return to walking, reduce pain in leg.    Currently in Pain? No/denies              [facility] PT Assessment - 09/12/19 0001      Assessment   Medical Diagnosis Difficulty walking    Referring Provider (PT) Dr. Alroy Dust Potter,MD    Onset Date/Surgical Date 02/04/19      Home Environment   Living Environment Private residence    Living Arrangements Alone      Prior Function   Level of Independence Needs assistance with ADLs;Needs assistance with homemaking;Needs assistance with transfers      Cognition   Overall Cognitive Status Within Functional Limits for tasks assessed            There Ex:  Standing in // bars: pt completed  8-10 reps of standing in // bars to improve standing tolerance. Stands lasted in duration from 1-4 minutes at a time. Pt able to come to standing with heavy UE usage, very little contribution from bilat LE. PT in front blocking knees to prevent buckling. Pt able to progress to standing with weight shifting.  Assist required with donning/ doffing shoes/ brace     PT Long Term Goals - 09/12/19 1236      PT LONG TERM GOAL #1   Title Pt. will complete stand pivot sit transfer with least restrictive assistive device independently to improve functional mobility    Baseline Pt. unable to complete without walker and requires mod assist x2 8/9: pt unable to complete    Time 4    Period Weeks    Status Not Met    Target Date 10/10/19      PT LONG TERM GOAL #2   Title Pt. will increase bilat. LE strength to 3/5 for hip flex, abd, and add, as well as knee flex and ext to improve gait.    Baseline Pt. unable to complete full ROM in bilat LE. 8/9: pt unable to complete    Time 4    Period Weeks    Status Not Met  Target Date 10/10/19      PT LONG TERM GOAL #3   Title Pt. will be able to ambulate 10 feet with least restrictive assistive device to access bathroom at home to improve functional mobility.    Baseline Pt. unable to ambulate 8/9: pt unable to ambulate    Time 4    Period Weeks    Status Not Met    Target Date 10/10/19      PT LONG TERM GOAL #4   Title Pt. will improve FOTO score to 65 to improve pain free mobility.    Baseline Pt. scored 47 on 7/1/2; 8/9: not assessed    Time 4    Period Weeks    Status On-going    Target Date 10/10/19                 Plan - 09/12/19 1238    Clinical Impression Statement Pt. able to complete multiple bouts of standing today in // bars. Pt. relies on UE to pull to standing, and hyperextends bilat knees to maintain standing to prevent buckling. Pt's AFO continues to cause issues during therapy with lateral strut popping out of shoe  continuously. Pt. is still unable to actively engage LE muscles or control LEs. Pt. would strongly benefit from OT or in home care. Pt. would additionally benefit from home equipment to assist with bathing and other ADLs safely. Pt. will continue to benefit from skilled PT to improve safety with transfers and to improve ability to safely and independently complete ADLs.    Personal Factors and Comorbidities Transportation    Examination-Activity Limitations Bathing;Bed Mobility;Dressing;Hygiene/Grooming;Lift;Squat;Stairs;Stand;Transfers    Stability/Clinical Decision Making Unstable/Unpredictable    Clinical Decision Making High    Rehab Potential Fair    PT Frequency 2x / week    PT Duration 4 weeks    PT Treatment/Interventions ADLs/Self Care Home Management;Electrical Stimulation;Gait training;Therapeutic activities;Functional mobility training;Stair training;Therapeutic exercise;Balance training;Neuromuscular re-education;Patient/family education;Orthotic Fit/Training;Passive range of motion;Manual techniques    PT Next Visit Plan Transfer training.  Progress HEP (supine based ex.).    PT Home Exercise Plan [id number]    Consulted and Agree with Plan of Care Patient           Patient will benefit from skilled therapeutic intervention in order to improve the following deficits and impairments:  Abnormal gait, Decreased endurance, Decreased activity tolerance, Decreased balance, Decreased coordination, Difficulty walking, Postural dysfunction, Decreased strength, Decreased mobility, Improper body mechanics, Decreased range of motion  Visit Diagnosis: Muscle weakness (generalized)  Imbalance  Difficulty in walking, not elsewhere classified  Abnormal posture     Problem List There are no problems to display for this patient.  Pura Spice, PT, DPT # [id number], SPT 09/12/2019, 4:07 PM  [place] [facility] [address] [place], [place], [address] Phone: [phone]   Fax:  [phone]  Name: Karen Ray MRN: [id number] Date of Birth: 06/11/1965

## 2019-09-14 ENCOUNTER — Other Ambulatory Visit: Payer: Self-pay

## 2019-09-14 ENCOUNTER — Ambulatory Visit: Payer: Medicaid Other | Admitting: Physical Therapy

## 2019-09-14 ENCOUNTER — Encounter: Payer: Self-pay | Admitting: Physical Therapy

## 2019-09-14 DIAGNOSIS — M6281 Muscle weakness (generalized): Secondary | ICD-10-CM

## 2019-09-14 DIAGNOSIS — R262 Difficulty in walking, not elsewhere classified: Secondary | ICD-10-CM

## 2019-09-14 DIAGNOSIS — R2689 Other abnormalities of gait and mobility: Secondary | ICD-10-CM

## 2019-09-14 DIAGNOSIS — R293 Abnormal posture: Secondary | ICD-10-CM

## 2019-09-14 NOTE — Therapy (Signed)
[facility] [address] [place], [place], [address] Phone: [phone]   Fax:  [phone]  Physical Therapy Treatment  Patient Details  Name: Karen Ray MRN: [id number] Date of Birth: Feb 02, 1966 Referring Provider (PT): Dr. Margart Sickles   Encounter Date: 09/14/2019   PT End of Session - 09/14/19 1240    Visit Number 6    Number of Visits 13    Date for PT Re-Evaluation 10/10/19    Authorization - Visit Number 2    Authorization - Number of Visits 10    PT Start Time 1110    PT Stop Time 1151    PT Time Calculation (min) 41 min    Equipment Utilized During Treatment Gait belt;Other (comment)   walker   Activity Tolerance Patient tolerated treatment well;Patient limited by fatigue    Behavior During Therapy [facility] for tasks assessed/performed           Past Medical History:  Diagnosis Date  . Hypertension   . Kidney disease, chronic, stage III (moderate, EGFR 30-59 ml/min)     History reviewed. No pertinent surgical history.  There were no vitals filed for this visit.   Subjective Assessment - 09/14/19 1238    Subjective Reports only eating once a day due to fear of having multiple soft BM's and difficutly getting to bathroom safely and in time enough. Soreness in B shoudler from weightbearing in PT in // bars. No pain reported.    Limitations Standing;Walking    Patient Stated Goals wants to return to walking, reduce pain in leg.    Currently in Pain? No/denies    Pain Score 0-No pain           There.ex:    Standing bouts with BUE pulling to stand in // bars: 5 min x2 with BUE's min support for standing w/ B hyperextension of knees.   Standing bouts with BUE pulling to stand in // bars: 2 min x2 with lateral weight shifts.   Standing bouts with BUE pulling to stand in // bars: 2 min x2 with fwd bckwd weight shifts.   Standing bout with BUE pulling to stand in // bars: x3 flexing and extending L knee.  Inability to activate R knee.   Seated rest breaks b/t all bouts of standing. Education on contacting physician about better eating habits and GI issues with frequent BM's after eating during seated rest.  TRX STS with ModA+2 for STS. Stood for 1 min and requested seated rest.     PT Education - 09/14/19 1239    Education Details Talking to doctor about inability to eat more than 1x a day due to fear of having urgent need for multiple BM's.    Person(s) Educated Patient    Methods Explanation    Comprehension Verbalized understanding               PT Long Term Goals - 09/12/19 1236      PT LONG TERM GOAL #1   Title Pt. will complete stand pivot sit transfer with least restrictive assistive device independently to improve functional mobility    Baseline Pt. unable to complete without walker and requires mod assist x2 8/9: pt unable to complete    Time 4    Period Weeks    Status Not Met    Target Date 10/10/19      PT LONG TERM GOAL #2   Title Pt. will increase bilat. LE strength to 3/5  for hip flex, abd, and add, as well as knee flex and ext to improve gait.    Baseline Pt. unable to complete full ROM in bilat LE. 8/9: pt unable to complete    Time 4    Period Weeks    Status Not Met    Target Date 10/10/19      PT LONG TERM GOAL #3   Title Pt. will be able to ambulate 10 feet with least restrictive assistive device to access bathroom at home to improve functional mobility.    Baseline Pt. unable to ambulate 8/9: pt unable to ambulate    Time 4    Period Weeks    Status Not Met    Target Date 10/10/19      PT LONG TERM GOAL #4   Title Pt. will improve FOTO score to 65 to improve pain free mobility.    Baseline Pt. scored 47 on 7/1/2; 8/9: not assessed    Time 4    Period Weeks    Status On-going    Target Date 10/10/19                 Plan - 09/14/19 1240    Clinical Impression Statement Similar to previous session, pt requires BUE to pull into  standing and must hyperextend B knees in order to safely stand with supervision in // bars. Pt demonstrated ability to stand x2 for 5 min bouts before needing seated rest only requiring supervision from therapist. Issues with R AFO lat strut popping out of shoe needing pt to sit in order to reconnect. Pt able to perform TRX STS x1 with ModA+2 for transfer into standing. Pt can continue to benefit from skilled PT to improve functional mobility.    Personal Factors and Comorbidities Transportation    Examination-Activity Limitations Bathing;Bed Mobility;Dressing;Hygiene/Grooming;Lift;Squat;Stairs;Stand;Transfers    Stability/Clinical Decision Making Unstable/Unpredictable    Clinical Decision Making High    Rehab Potential Fair    PT Frequency 2x / week    PT Duration 4 weeks    PT Treatment/Interventions ADLs/Self Care Home Management;Electrical Stimulation;Gait training;Therapeutic activities;Functional mobility training;Stair training;Therapeutic exercise;Balance training;Neuromuscular re-education;Patient/family education;Orthotic Fit/Training;Passive range of motion;Manual techniques    PT Next Visit Plan Transfer training.  Progress HEP (supine based ex.).    PT Home Exercise Plan [id number]    Consulted and Agree with Plan of Care Patient           Patient will benefit from skilled therapeutic intervention in order to improve the following deficits and impairments:  Abnormal gait, Decreased endurance, Decreased activity tolerance, Decreased balance, Decreased coordination, Difficulty walking, Postural dysfunction, Decreased strength, Decreased mobility, Improper body mechanics, Decreased range of motion  Visit Diagnosis: Muscle weakness (generalized)  Imbalance  Difficulty in walking, not elsewhere classified  Abnormal posture     Problem List There are no problems to display for this patient.  Pura Spice, PT, DPT # [id number] Larna Daughters, SPT 09/15/2019, 9:17 AM  [facility] [address] [place], [place], [address] Phone: [phone]   Fax:  [phone]  Name: Karen Ray MRN: [id number] Date of Birth: 02-08-1965

## 2019-09-19 ENCOUNTER — Other Ambulatory Visit: Payer: Self-pay

## 2019-09-19 ENCOUNTER — Ambulatory Visit: Payer: Medicaid Other | Admitting: Physical Therapy

## 2019-09-19 ENCOUNTER — Encounter: Payer: Self-pay | Admitting: Physical Therapy

## 2019-09-19 DIAGNOSIS — R262 Difficulty in walking, not elsewhere classified: Secondary | ICD-10-CM

## 2019-09-19 DIAGNOSIS — R293 Abnormal posture: Secondary | ICD-10-CM

## 2019-09-19 DIAGNOSIS — M6281 Muscle weakness (generalized): Secondary | ICD-10-CM

## 2019-09-19 DIAGNOSIS — R2689 Other abnormalities of gait and mobility: Secondary | ICD-10-CM

## 2019-09-19 NOTE — Therapy (Signed)
[facility] [address] [place], [place], [address] Phone: [phone]   Fax:  [phone]  Physical Therapy Treatment  Patient Details  Name: Karen Ray MRN: [id number] Date of Birth: 03/01/1965 Referring Provider (PT): Dr. Margart Sickles   Encounter Date: 09/19/2019   PT End of Session - 09/19/19 1058    Visit Number 7    Number of Visits 13    Date for PT Re-Evaluation 10/10/19    Authorization - Visit Number 3    Authorization - Number of Visits 10    PT Start Time 1050    PT Stop Time 8338    PT Time Calculation (min) 45 min    Equipment Utilized During Treatment Gait belt;Other (comment)   walker   Activity Tolerance Patient tolerated treatment well;Patient limited by fatigue    Behavior During Therapy [facility] for tasks assessed/performed           Past Medical History:  Diagnosis Date  . Hypertension   . Kidney disease, chronic, stage III (moderate, EGFR 30-59 ml/min)     History reviewed. No pertinent surgical history.  There were no vitals filed for this visit.   Subjective Assessment - 09/19/19 1049    Subjective Pt. states she has eaten a little bit today. Pt. states that R LE has been hurting more than normal since last night, including some tingling. Pt unable to give clear explanation of where the tingling is coming from. Pt. states R leg is "sore," but feels okay when straight.    Limitations Standing;Walking    Patient Stated Goals wants to return to walking, reduce pain in leg.    Currently in Pain? Yes   pt unable to give rating         Neuro Re-Ed:   STS in // bars from motor chair and BUE support on bars to stand.   Weight shifts to R/L. Verbal cues for slowing down weight shifts. 1.5 min x6  Weight shifts with L knee bend 3 min, R knee bend 1 min total standing bout of 4 min.   1 min stand. Required seated rest due to L glut pain.   Standing marches: pt unable to complete full  marches, but activated hip flexors and glutes to complete. 2x 4 marches each leg  Pt education during seated rest break on wearing R conventional AFO with shoes during the whole day until bed time for R ankle stability.        PT Education - 09/19/19 1224    Education Details PT discussed benefits of working with OT for bathing/ dressing at home.    Person(s) Educated Patient    Methods Explanation    Comprehension Verbalized understanding;Returned demonstration               PT Long Term Goals - 09/12/19 1236      PT LONG TERM GOAL #1   Title Pt. will complete stand pivot sit transfer with least restrictive assistive device independently to improve functional mobility    Baseline Pt. unable to complete without walker and requires mod assist x2 8/9: pt unable to complete    Time 4    Period Weeks    Status Not Met    Target Date 10/10/19      PT LONG TERM GOAL #2   Title Pt. will increase bilat. LE strength to 3/5 for hip flex, abd, and add, as well as knee flex and ext to improve  gait.    Baseline Pt. unable to complete full ROM in bilat LE. 8/9: pt unable to complete    Time 4    Period Weeks    Status Not Met    Target Date 10/10/19      PT LONG TERM GOAL #3   Title Pt. will be able to ambulate 10 feet with least restrictive assistive device to access bathroom at home to improve functional mobility.    Baseline Pt. unable to ambulate 8/9: pt unable to ambulate    Time 4    Period Weeks    Status Not Met    Target Date 10/10/19      PT LONG TERM GOAL #4   Title Pt. will improve FOTO score to 65 to improve pain free mobility.    Baseline Pt. scored 47 on 7/1/2; 8/9: not assessed    Time 4    Period Weeks    Status On-going    Target Date 10/10/19                 Plan - 09/19/19 1135    Clinical Impression Statement Pt. continues to require bilat UE usage to come to standing in // bars. Pt. able to practice standing with knees unlocked with PT assist to  unlock knees and having knees blocked from buckling. Pt. additionally attempted standing marches today with stance knee blocked and heavy bilat UE support. Pt. unable to complete full marches, but pt activated most of the necessary muscles to complete.  Pt. continues to have issues with R AFO lateral strut staying in shoe.  Pt. will continue to benefit from skilled PT to improve functional mobility and decrease falls risk.    Personal Factors and Comorbidities Transportation    Examination-Activity Limitations Bathing;Bed Mobility;Dressing;Hygiene/Grooming;Lift;Squat;Stairs;Stand;Transfers    Stability/Clinical Decision Making Unstable/Unpredictable    Clinical Decision Making High    Rehab Potential Fair    PT Frequency 2x / week    PT Duration 4 weeks    PT Treatment/Interventions ADLs/Self Care Home Management;Electrical Stimulation;Gait training;Therapeutic activities;Functional mobility training;Stair training;Therapeutic exercise;Balance training;Neuromuscular re-education;Patient/family education;Orthotic Fit/Training;Passive range of motion;Manual techniques    PT Next Visit Plan Transfer training.  Progress HEP (supine based ex.).  ISSUE ADDRESS OF MAIN [facility]OS[facility] FOR TRANSPORTATION.  DISCUSS OT    PT Home Exercise Plan [id number]    Consulted and Agree with Plan of Care Patient           Patient will benefit from skilled therapeutic intervention in order to improve the following deficits and impairments:  Abnormal gait, Decreased endurance, Decreased activity tolerance, Decreased balance, Decreased coordination, Difficulty walking, Postural dysfunction, Decreased strength, Decreased mobility, Improper body mechanics, Decreased range of motion  Visit Diagnosis: Muscle weakness (generalized)  Imbalance  Difficulty in walking, not elsewhere classified  Abnormal posture     Problem List There are no problems to display for this patient.  Pura Spice, PT, DPT # [id number]  ERDEY, SPT 09/19/2019, 12:30 PM  [facility] [address] [place], [place], [address] Phone: [phone]   Fax:  [phone]  Name: Karen Ray MRN: [id number] Date of Birth: Oct 16, 1965

## 2019-09-21 ENCOUNTER — Encounter: Payer: Self-pay | Admitting: Physical Therapy

## 2019-09-21 ENCOUNTER — Ambulatory Visit: Payer: Medicaid Other | Admitting: Physical Therapy

## 2019-09-21 ENCOUNTER — Other Ambulatory Visit: Payer: Self-pay

## 2019-09-21 DIAGNOSIS — M6281 Muscle weakness (generalized): Secondary | ICD-10-CM

## 2019-09-21 DIAGNOSIS — R2689 Other abnormalities of gait and mobility: Secondary | ICD-10-CM

## 2019-09-21 DIAGNOSIS — R262 Difficulty in walking, not elsewhere classified: Secondary | ICD-10-CM

## 2019-09-21 DIAGNOSIS — R293 Abnormal posture: Secondary | ICD-10-CM

## 2019-09-21 NOTE — Therapy (Signed)
[facility] [address] [place], [place], [address] Phone: [phone]   Fax:  [phone]  Physical Therapy Treatment  Patient Details  Name: Karen Ray MRN: [id number] Date of Birth: 28-Jan-1966 Referring Provider (PT): Dr. Margart Sickles   Encounter Date: 09/21/2019   PT End of Session - 09/21/19 1107    Visit Number 8    Number of Visits 13    Date for PT Re-Evaluation 10/10/19    Authorization - Visit Number 4    Authorization - Number of Visits 10    PT Start Time 5852    PT Stop Time 1142    PT Time Calculation (min) 45 min    Equipment Utilized During Treatment Gait belt;Other (comment)   walker   Activity Tolerance Patient tolerated treatment well;Patient limited by fatigue    Behavior During Therapy [facility] for tasks assessed/performed           Past Medical History:  Diagnosis Date  . Hypertension   . Kidney disease, chronic, stage III (moderate, EGFR 30-59 ml/min)     History reviewed. No pertinent surgical history.  There were no vitals filed for this visit.   Subjective Assessment - 09/21/19 1107    Subjective Pt. states that her L side hurt last night, she took some "pain relief" medication, unknown the type of medication, last night to be able to sleep.    Limitations Standing;Walking    Patient Stated Goals wants to return to walking, reduce pain in leg.    Currently in Pain? No/denies            There Ex:  // bars:  Sit to stand/standing: pt able to stand with knees hyperextended bilat with AFO on. During standing, PT positioned seated in front blocking knees. Pt comes to standing by pulling self up with UE on bars, very little LE activation. When pt comes to sit, pt unable to bend knees, and unable to control descent to chair without UE use.   Standing weight shifts: pt able to complete with knees hyperextended. Not able to bend R knee, but able to allow L knee to bend.  Standing  marches: 3x. Pt able to lift L leg off ground, able to lift R heel off ground during first two reps. Pt unable to complete marches during 3rd rep, but attempted to activate muscles to complete.    Seated upright posture: practicing sitting without back support in chair, various reaching outside BOS and overhead reaching while sitting. Required heavy verbal cueing to maintain upright posture. Pt unable to complete seated scap squeezes in this position, demonstrating severe forward and rounded shoulder posture. Pt practiced ball passes to therapist to facilitate forward leaning.          PT Long Term Goals - 09/21/19 1230      PT LONG TERM GOAL #1   Title Pt. will complete slide board transfer independently to improve functional mobility.    Baseline Pt. unable to complete without walker and requires mod assist x2 8/9: pt unable to complete    Time 4    Period Weeks    Status Revised    Target Date 10/10/19      PT LONG TERM GOAL #2   Title Pt. will increase bilat. LE strength to 3/5 for hip flex, abd, and add, as well as knee flex and ext to improve gait.    Baseline Pt. unable to complete full ROM in bilat LE. 8/9:  pt unable to complete 8/18: from EMG report, pt does not have innervation to some of these muscles, therefore will be unable to achieve this goal.    Time 4    Period Weeks    Status Unable to assess    Target Date 10/10/19      PT LONG TERM GOAL #3   Title Pt. will be able to ambulate 10 feet with least restrictive assistive device to access bathroom at home to improve functional mobility.    Baseline Pt. unable to ambulate 8/9: pt unable to ambulate; 8/18: Pt will likely not be able to improve strength due to EMG report.    Time 4    Period Weeks    Status Unable to assess    Target Date 10/10/19      PT LONG TERM GOAL #4   Title Pt. will improve FOTO score to 65 to improve pain free mobility.    Baseline Pt. scored 47 on 7/1/2; 8/9: not assessed    Time 4     Period Weeks    Status On-going    Target Date 10/10/19      PT LONG TERM GOAL #5   Title Pt. will be able to put on shoes independently to safety for standing and transfer ability.    Baseline 8/18: pt unable to put on shoes    Time 4    Period Weeks    Status New    Target Date 10/10/19      Additional Long Term Goals   Additional Long Term Goals Yes      PT LONG TERM GOAL #6   Title Pt will be able to sit independently without back support to improve trunk strength for transfers.    Baseline 8/18: pt unable to sit with upright posture for more than 10 seconds.    Time 4    Period Weeks    Status New    Target Date 10/10/19              Plan - 09/21/19 1244    Clinical Impression Statement Pt. focused on standing for most of treatment session, pt able to complete multiple stands with improved endurance. Pt. able to complete 2 reps of marches in standing where L leg left the ground, and R heel left the ground. Pt. attempted to practice upright sitting with moderate success. See tx note for details on interventions. PT added new goals today. Pt. will begin therapy at [facility] outpatient PT and OT next week. Pt. will continue to benefit from skilled PT to improve safety with transfers and ADLs.    Personal Factors and Comorbidities Transportation    Examination-Activity Limitations Bathing;Bed Mobility;Dressing;Hygiene/Grooming;Lift;Squat;Stairs;Stand;Transfers    Stability/Clinical Decision Making Unstable/Unpredictable    Clinical Decision Making High    Rehab Potential Fair    PT Frequency 2x / week    PT Duration 4 weeks    PT Treatment/Interventions ADLs/Self Care Home Management;Electrical Stimulation;Gait training;Therapeutic activities;Functional mobility training;Stair training;Therapeutic exercise;Balance training;Neuromuscular re-education;Patient/family education;Orthotic Fit/Training;Passive range of motion;Manual techniques    PT Next Visit Plan Transfer training.   Progress HEP (supine based ex.).    PT Home Exercise Plan [id number]    Consulted and Agree with Plan of Care Patient           Patient will benefit from skilled therapeutic intervention in order to improve the following deficits and impairments:  Abnormal gait, Decreased endurance, Decreased activity tolerance, Decreased balance, Decreased coordination, Difficulty walking, Postural dysfunction, Decreased  strength, Decreased mobility, Improper body mechanics, Decreased range of motion  Visit Diagnosis: Muscle weakness (generalized)  Imbalance  Difficulty in walking, not elsewhere classified  Abnormal posture     Problem List There are no problems to display for this patient.  Pura Spice, PT, DPT # [id number], SPT 09/21/2019, 3:52 PM  [facility] [address] [place], [place], [address] Phone: [phone]   Fax:  [phone]  Name: IMAGENE BOSS MRN: [id number] Date of Birth: 04/04/1965

## 2019-09-26 ENCOUNTER — Ambulatory Visit: Payer: Medicaid Other | Admitting: Physical Therapy

## 2019-09-27 ENCOUNTER — Ambulatory Visit: Payer: Medicaid Other | Admitting: Physical Therapy

## 2019-09-27 ENCOUNTER — Ambulatory Visit: Payer: Medicaid Other | Admitting: Occupational Therapy

## 2019-09-27 ENCOUNTER — Other Ambulatory Visit: Payer: Self-pay

## 2019-09-27 ENCOUNTER — Encounter: Payer: Self-pay | Admitting: Occupational Therapy

## 2019-09-27 ENCOUNTER — Encounter: Payer: Self-pay | Admitting: Physical Therapy

## 2019-09-27 DIAGNOSIS — R262 Difficulty in walking, not elsewhere classified: Secondary | ICD-10-CM

## 2019-09-27 DIAGNOSIS — R2689 Other abnormalities of gait and mobility: Secondary | ICD-10-CM

## 2019-09-27 DIAGNOSIS — M6281 Muscle weakness (generalized): Secondary | ICD-10-CM

## 2019-09-27 DIAGNOSIS — R278 Other lack of coordination: Secondary | ICD-10-CM

## 2019-09-27 DIAGNOSIS — R293 Abnormal posture: Secondary | ICD-10-CM

## 2019-09-27 NOTE — Therapy (Signed)
[place] MAIN [facility] SERVICES [address] [place], [place], [address] Phone: [phone]   Fax:  [phone]  Physical Therapy Treatment  Patient Details  Name: Karen Ray MRN: [id number] Date of Birth: 19-Dec-1965 Referring Provider (PT): Dr. Margart Sickles   Encounter Date: 09/27/2019   PT End of Session - 09/27/19 1048    Visit Number 9    Number of Visits 13    Date for PT Re-Evaluation 10/10/19    Authorization - Visit Number 4    Authorization - Number of Visits 10    PT Start Time 2993    PT Stop Time 1100    PT Time Calculation (min) 45 min    Equipment Utilized During Treatment Gait belt;Other (comment)   walker   Activity Tolerance Patient tolerated treatment well;Patient limited by fatigue    Behavior During Therapy [facility] for tasks assessed/performed           Past Medical History:  Diagnosis Date  . Hypertension   . Kidney disease, chronic, stage III (moderate, EGFR 30-59 ml/min)     History reviewed. No pertinent surgical history.  There were no vitals filed for this visit.   Subjective Assessment - 09/27/19 1046    Subjective Patient reports that her right leg shakes. when she uses her arms and trunk in sitting. She has no pain. Her feet are swollen.    Currently in Pain? No/denies    Pain Score 0-No pain           Treatment: Patient performs scooter to mat transfer with mod assist x 2 with pivot transfer; not safe due to profound weakness Patient performs right and left lateral trunk bends x 10 x 2 sets  Patient performs trunk extension to rest on wedge and pillow and then trunk flex x 10 x 2  Seated LAQ BLE with poor quality of movement x 10 , RLE shaking and LLE is assisted with her LUE x 10  Patient performed with instruction, verbal cues, tactile cues of therapist: goal: increase tissue extensibility, promote proper posture, improve mobility                          PT  Education - 09/27/19 1047    Education Details poor safety with transfers power wc to mat .    Person(s) Educated Patient    Methods Explanation    Comprehension Verbalized understanding               PT Long Term Goals - 09/21/19 1230      PT LONG TERM GOAL #1   Title Pt. will complete slide board transfer independently to improve functional mobility.    Baseline Pt. unable to complete without walker and requires mod assist x2 8/9: pt unable to complete    Time 4    Period Weeks    Status Revised    Target Date 10/10/19      PT LONG TERM GOAL #2   Title Pt. will increase bilat. LE strength to 3/5 for hip flex, abd, and add, as well as knee flex and ext to improve gait.    Baseline Pt. unable to complete full ROM in bilat LE. 8/9: pt unable to complete 8/18: from EMG report, pt does not have innervation to some of these muscles, therefore will be unable to achieve this goal.    Time 4    Period Weeks    Status  Unable to assess    Target Date 10/10/19      PT LONG TERM GOAL #3   Title Pt. will be able to ambulate 10 feet with least restrictive assistive device to access bathroom at home to improve functional mobility.    Baseline Pt. unable to ambulate 8/9: pt unable to ambulate; 8/18: Pt will likely not be able to improve strength due to EMG report.    Time 4    Period Weeks    Status Unable to assess    Target Date 10/10/19      PT LONG TERM GOAL #4   Title Pt. will improve FOTO score to 65 to improve pain free mobility.    Baseline Pt. scored 47 on 7/1/2; 8/9: not assessed    Time 4    Period Weeks    Status On-going    Target Date 10/10/19      PT LONG TERM GOAL #5   Title Pt. will be able to put on shoes independently to safety for standing and transfer ability.    Baseline 8/18: pt unable to put on shoes    Time 4    Period Weeks    Status New    Target Date 10/10/19      Additional Long Term Goals   Additional Long Term Goals Yes      PT LONG TERM GOAL  #6   Title Pt will be able to sit independently without back support to improve trunk strength for transfers.    Baseline 8/18: pt unable to sit with upright posture for more than 10 seconds.    Time 4    Period Weeks    Status New    Target Date 10/10/19                 Plan - 09/27/19 1048    Clinical Impression Statement Patient has profound weakness in BLE and trunk and has abnormal tone in RLE resulting in leg shaking with intentional movement. She transfers from scooter to mat with SPT but very unsafe and has poor trunk stability and poor LE strength. She transfers back from raised mat table to scooter with mod assist using elevated mat table to assist. She is able to scoot herself all the way back into the scooter with max  assist to block her LE's and hold them on the foot plate. Patient reports that she does not know why she is so weak in her legs and trunk and was advised to talk to her MD about this. She sleeps in a recliner lift chair at home. I educated patient that  I don't feel that she is safe to live at home due to her poor mobility and strength unless she has 24 hour assistance.  She needs a different seating system for better LE support and pressure relief.    Personal Factors and Comorbidities Transportation    Examination-Activity Limitations Bathing;Bed Mobility;Dressing;Hygiene/Grooming;Lift;Squat;Stairs;Stand;Transfers    Stability/Clinical Decision Making Unstable/Unpredictable    Rehab Potential Fair    PT Frequency 2x / week    PT Duration 4 weeks    PT Treatment/Interventions ADLs/Self Care Home Management;Electrical Stimulation;Gait training;Therapeutic activities;Functional mobility training;Stair training;Therapeutic exercise;Balance training;Neuromuscular re-education;Patient/family education;Orthotic Fit/Training;Passive range of motion;Manual techniques    PT Next Visit Plan Transfer training.  Progress HEP (supine based ex.).    PT Home Exercise Plan  [id number]    Consulted and Agree with Plan of Care Patient  Patient will benefit from skilled therapeutic intervention in order to improve the following deficits and impairments:  Abnormal gait, Decreased endurance, Decreased activity tolerance, Decreased balance, Decreased coordination, Difficulty walking, Postural dysfunction, Decreased strength, Decreased mobility, Improper body mechanics, Decreased range of motion  Visit Diagnosis: Muscle weakness (generalized)  Imbalance  Difficulty in walking, not elsewhere classified  Abnormal posture     Problem List There are no problems to display for this patient.   [address] [place] DPT 09/27/2019, 10:49 AM  [place] MAIN [facility] SERVICES [address] [place], [place], [address] Phone: [phone]   Fax:  [phone]  Name: Karen Ray MRN: [id number] Date of Birth: 1965/08/25

## 2019-09-28 ENCOUNTER — Encounter: Payer: Medicaid Other | Admitting: Physical Therapy

## 2019-09-29 ENCOUNTER — Other Ambulatory Visit: Payer: Self-pay

## 2019-09-29 ENCOUNTER — Ambulatory Visit: Payer: Medicaid Other | Admitting: Physical Therapy

## 2019-09-29 ENCOUNTER — Encounter: Payer: Self-pay | Admitting: Physical Therapy

## 2019-09-29 DIAGNOSIS — R262 Difficulty in walking, not elsewhere classified: Secondary | ICD-10-CM

## 2019-09-29 DIAGNOSIS — M6281 Muscle weakness (generalized): Secondary | ICD-10-CM | POA: Diagnosis not present

## 2019-09-29 DIAGNOSIS — R2689 Other abnormalities of gait and mobility: Secondary | ICD-10-CM

## 2019-09-29 DIAGNOSIS — R293 Abnormal posture: Secondary | ICD-10-CM

## 2019-09-29 NOTE — Therapy (Signed)
[place] MAIN [facility] SERVICES [address] [place], [place], [address] Phone: [phone]   Fax:  [phone]  Physical Therapy Treatment Progress Note Physical Therapy Progress Note   Dates of reporting period  08/04/19  to  09/29/19 Discharge summary Patient Details  Name: Karen Ray MRN: [id number] Date of Birth: 1965-04-16 Referring Provider (PT): Dr. Margart Sickles   Encounter Date: 09/29/2019   PT End of Session - 09/29/19 1118    Visit Number 10    Number of Visits 13    Date for PT Re-Evaluation 10/10/19    Authorization - Visit Number 4    Authorization - Number of Visits 10    PT Start Time 1937    PT Stop Time 9024    PT Time Calculation (min) 43 min    Equipment Utilized During Treatment Gait belt;Other (comment)   walker   Activity Tolerance Patient tolerated treatment well;Patient limited by fatigue    Behavior During Therapy WFL for tasks assessed/performed           Past Medical History:  Diagnosis Date   Hypertension    Kidney disease, chronic, stage III (moderate, EGFR 30-59 ml/min)     History reviewed. No pertinent surgical history.  There were no vitals filed for this visit.   Subjective Assessment - 09/29/19 1116    Subjective Patient reports that her right leg shakes. when she uses her arms and trunk in sitting. She has no pain. Her feet are swollen.No new concerns.    Pertinent History Patient has not walked in 2 years and she needs mod/max assist for dressing and bathing. She uses a lift chair at home for transfers.    Limitations Standing;Walking    Patient Stated Goals wants to return to walking, reduce pain in leg.           Treatment:  Reviewed goals with patient.  Discussed need for different power chair due to hover round not meeting her needs for foot placement and needing a power wc that will raise her up for sliding board transfers.  Discussed DC for PT due to not meeting or  progressing towards any of her goals.                        PT Education - 09/29/19 1117    Education Details 10th visit progress note    Person(s) Educated Patient    Methods Explanation    Comprehension Verbalized understanding               PT Long Term Goals - 09/29/19 1152      PT LONG TERM GOAL #1   Title Pt. will complete slide board transfer independently to improve functional mobility.    Baseline Pt. unable to complete without walker and requires mod assist x2 8/9: pt unable to complete . 09/29/19= Patient needs max assist x 2 for sliding board transfer.    Time 4    Period Weeks    Status Not Met    Target Date 09/29/19      PT LONG TERM GOAL #2   Title Pt. will increase bilat. LE strength to 3/5 for hip flex, abd, and add, as well as knee flex and ext to improve gait.    Baseline Pt. unable to complete full ROM in bilat LE. 8/9: pt unable to complete 8/18: from EMG report, pt does not have innervation to some of these muscles, therefore  will be unable to achieve this goal.09/29/19=  from EMG report, pt does not have innervation to some of these muscles, therefore will be unable to achieve this goal    Time 4    Period Weeks    Status Not Met    Target Date 09/29/19      PT LONG TERM GOAL #3   Title Pt. will be able to ambulate 10 feet with least restrictive assistive device to access bathroom at home to improve functional mobility.    Baseline Pt. unable to ambulate 8/9: pt unable to ambulate; 8/18: Pt will likely not be able to improve strength due to EMG report., 09/29/19=Pt will likely not be able to improve strength due to EMG report    Time 4    Period Weeks    Status Not Met    Target Date 09/29/19      PT LONG TERM GOAL #4   Title Pt. will improve FOTO score to 65 to improve pain free mobility.    Baseline Pt. scored 47 on 7/1/2; 8/9: not assessed8/26/21=Pt will likely not be able to improve strength due to EMG report    Time 4     Period Weeks    Status Not Met    Target Date 09/29/19      PT LONG TERM GOAL #5   Title Pt. will be able to put on shoes independently to safety for standing and transfer ability.    Baseline 8/18: pt unable to put on shoes, 09/29/19= pt unable to put her shoes on    Time 4    Period Weeks    Status Not Met    Target Date 09/29/19      PT LONG TERM GOAL #6   Title Pt will be able to sit independently without back support to improve trunk strength for transfers.    Baseline 8/18: pt unable to sit with upright posture for more than 10 seconds., 09/29/19=Patient can sit independently with feet supported    Time 4    Period Weeks    Status On-going    Target Date 09/29/19                 Plan - 09/29/19 1118    Clinical Impression Statement Patient has not demonstrated improved dynamic balance, improved mobility  or improved LE strength, and  Pt will likely not be able to improve strength due to EMG report., Patient has not  made progress with goals # 1,2,3,4,5, and reached goals #6. Patient was educated about her need of a power WC to raise her up for ease of transfers and better LE support and better seat cushion to avoid skin breakdowns. Patient will be discharged from skilled physical therapy . Marland Kitchen    Personal Factors and Comorbidities Transportation    Examination-Activity Limitations Bathing;Bed Mobility;Dressing;Hygiene/Grooming;Lift;Squat;Stairs;Stand;Transfers    Stability/Clinical Decision Making Unstable/Unpredictable    Rehab Potential Fair    PT Frequency 2x / week    PT Duration 4 weeks    PT Treatment/Interventions ADLs/Self Care Home Management;Electrical Stimulation;Gait training;Therapeutic activities;Functional mobility training;Stair training;Therapeutic exercise;Balance training;Neuromuscular re-education;Patient/family education;Orthotic Fit/Training;Passive range of motion;Manual techniques    PT Next Visit Plan Transfer training.  Progress HEP (supine based  ex.).    PT Home Exercise Plan [id number]    Consulted and Agree with Plan of Care Patient           Patient will benefit from skilled therapeutic intervention in order to improve the following deficits and  impairments:  Abnormal gait, Decreased endurance, Decreased activity tolerance, Decreased balance, Decreased coordination, Difficulty walking, Postural dysfunction, Decreased strength, Decreased mobility, Improper body mechanics, Decreased range of motion  Visit Diagnosis: Muscle weakness (generalized)  Imbalance  Difficulty in walking, not elsewhere classified  Abnormal posture     Problem List There are no problems to display for this patient.   [address], [place] DPT 09/29/2019, 11:57 AM  [place] MAIN [facility] SERVICES [address] [place], [place], [address] Phone: [phone]   Fax:  [phone]  Name: Karen Ray MRN: [id number] Date of Birth: 12/30/1965

## 2019-09-30 NOTE — Therapy (Signed)
[place] MAIN [facility] SERVICES [address] [place], [place], [address] Phone: [phone]   Fax:  [phone]  Occupational Therapy Evaluation  Patient Details  Name: Karen Ray MRN: [id number] Date of Birth: [birth year] Referring Provider (OT): Karen Kitchens   Encounter Date: 09/27/2019   OT End of Session - 09/30/19 0853    Visit Number 1    Number of Visits 24    Date for OT Re-Evaluation 12/20/19    Authorization Type [employer]    OT Start Time 1101    OT Stop Time 1159    OT Time Calculation (min) 58 min    Activity Tolerance Patient tolerated treatment well    Behavior During Therapy [facility] for tasks assessed/performed           Past Medical History:  Diagnosis Date  . Hypertension   . Kidney disease, chronic, stage III (moderate, EGFR 30-59 ml/min)     History reviewed. No pertinent surgical history.  There were no vitals filed for this visit.      [facility] OT Assessment - 09/30/19 0847      Assessment   Medical Diagnosis Difficulty walking    Referring Provider (OT) Melrose Nakayama, Z    Onset Date/Surgical Date 02/04/19    Prior Therapy no prior therapy      Precautions   Required Braces or Orthoses Other Brace/Splint   right leg brace      Balance Screen   Has the patient fallen in the past 6 months No    Has the patient had a decrease in activity level because of a fear of falling?  No    Is the patient reluctant to leave their home because of a fear of falling?  No      Home  Environment   Family/patient expects to be discharged to: Private residence    Living Arrangements Alone    Available Help at Discharge Family    Type of [place] Access Level entry    [place] One level    Bathroom Shower/Tub [place] Yes    How accessible Accessible via wheelchair    Tax adviser scooter;Toilet riser    Additional Comments Currently sleeps in recliner/lift  chair, she does sponge baths, able to get scooter in bathroom for toileting.      Lives With Alone      Prior Function   Level of Independence Needs assistance with ADLs;Needs assistance with homemaking;Needs assistance with transfers      ADL   Eating/Feeding Modified independent    Grooming Modified independent    Upper Body Bathing Minimal assistance    Lower Body Bathing Moderate assistance    Upper Body Dressing Increased time;Minimal assistance;Moderate assistance    Lower Body Dressing Moderate assistance    Toilet Transfer Modified independent    Toileting - Clothing Manipulation Modified independent    Toileting -  Hygiene Increase time;Modified Independent    ADL comments Pt has a personal care aide who helps everyday for 2 hours, 7 days a week.  She requires assist with dressing, cooking, cleaning, bathing.  She depends on public transportation arranged by her insurance.  She is incontinent and wears depends undergarments.  She reports having a lift chair to assist with getting up.        IADL   Prior Level of Function Shopping independent    Shopping Completely unable to shop  Prior Level of Function Light Housekeeping independent    Light Housekeeping Does not participate in any housekeeping tasks    Prior Level of Function Meal Prep independent    Meal Prep Needs to have meals prepared and served    [employer] on family or friends for transportation    Prior Level of Function Medication Managment independent    Medication Management Is responsible for taking medication in correct dosages at correct time    Prior Level of Function Therapist, sports financial matters independently (budgets, writes checks, pays rent, bills goes to bank), collects and keeps track of income      Mobility   Mobility Status Needs assist    Mobility Status Comments patient reports she hasn't walked in over 2 years.       Written  Expression   Dominant Hand Right      Vision - History   Baseline Vision No visual deficits      Cognition   Overall Cognitive Status Cognition to be further assessed in functional context PRN    Cognition Comments Patient is a questionable historian, details of daily routine discussed with OT vary from what patient discussed with PT.        Posture/Postural Control   Posture Comments Decreased trunk control with sitting, weakness in trunk noted.       Sensation   Light Touch Appears Intact    Hot/Cold Appears Intact      Coordination   Gross Motor Movements are Fluid and Coordinated No    Fine Motor Movements are Fluid and Coordinated No    Right 9 Hole Peg Test 1 min 4 sec    Left 9 Hole Peg Test 55    Coordination impaired finger to nose mildly      Hand Function   Right Hand Grip (lbs) 24    Right Hand Lateral Pinch 6 lbs    Right Hand 3 Point Pinch 8 lbs    Left Hand Grip (lbs) 25    Left Hand Lateral Pinch 7 lbs    Left 3 point pinch 9 lbs          Patient reports her arm is sore from receiving her 2nd covid vaccination Bilateral shoulder flexion 130 degrees, full elbow flexion/extension Full fisting bilaterally and full opposition of thumb to digits.    Patient reports a fall earlier in the year but denies any other falls.  She lives alone and has limited caregiver support 2 hours a day, 7 days a week.  She relies on her lift chair to lift her into a standing position and she stand pivots to her hoverround Transport planner.  She requires increased assist in the clinic for transfers, mod assist of 2.  Patient demonstrates decreased trunk control and trunk weakness affecting her posture with sitting. Patient sleeps in her lift chair at home and is unable to shower due to weakness, performs sponge baths only and with assist.                        OT Long Term Goals - 09/30/19 0907      OT LONG TERM GOAL #1   Title Patient will improve BUE strength  by 1 mm grade to assist with self care tasks and decrease caregiver burden.    Baseline 3/5 overall bilateral UE    Time 12    Period Weeks  Status New    Target Date 12/20/19      OT LONG TERM GOAL #2   Title Patient will complete lower body bathing with min assist and use of adaptive equipment as needed.    Baseline moderate assist to max with bathing at eval    Time 12    Period Weeks    Status New    Target Date 12/20/19      OT LONG TERM GOAL #3   Title Patient will be modified independent with home exercise program for ROM, strength and coordination.    Baseline no current program    Time 12    Period Weeks    Status New    Target Date 12/20/19      OT LONG TERM GOAL #4   Title Patient will perform UB dressing with modified independence.    Baseline min to mod assist at eval    Time 6    Period Weeks    Status New    Target Date 11/08/19      OT LONG TERM GOAL #5   Title Pt will complete lower body dressing with min assist and adaptive equipment as needed.    Baseline mod to max assist at eval unable to wear shoes and AFO to therapy    Time 12    Period Weeks    Status New    Target Date 12/20/19                 Plan - 09/30/19 0854    Clinical Impression Statement Patient is a 54 yo female referred to OT for evaluation for weakness.  Patient lives alone, has limited caregiver support 2 hours a day, uses hover round electric scooter for mobility, has a lift chair which she is dependent on for transfers, sleeps in her lift chair.  She is not currently able to perform shower transfers due to weakness.  She is unable to use the stove for meals and requires frozen options and use of microwave.  Patient requires increased assist with transfers and is unsafe with transfers in the clinic.  She has a history of falls and arrived at the clinic without shoes on and states the AFO and shoes are too heavy on her feet.  Patient demonstrates poor memory and information  provided to PT and OT differ as to her current level of function.  She would benefit from skilled OT services to maximize her safety and independence for necessary daily tasks however she would ideally benefit from home health versus outpatient.  Patient would likely demonstrate the most progress if treated in her own environment, with modifications to her home setup and instruction/ practice in the home setting however we will attempt a trial of OT in the outpatient setting at this time.    OT Occupational Profile and History Detailed Assessment- Review of Records and additional review of physical, cognitive, psychosocial history related to current functional performance    Occupational performance deficits (Please refer to evaluation for details): ADL's;IADL's;Social Participation;Leisure    Body Structure / Function / Physical Skills ADL;Continence;Dexterity;Flexibility;ROM;Strength;Balance;Coordination;FMC;IADL;Endurance;UE functional use;Decreased knowledge of use of DME;GMC;Mobility    Cognitive Skills Attention;Memory;Safety Awareness    Psychosocial Skills Environmental  Adaptations;Habits;Routines and Behaviors    Rehab Potential Good    Clinical Decision Making Limited treatment options, no task modification necessary    Comorbidities Affecting Occupational Performance: Presence of comorbidities impacting occupational performance    Comorbidities impacting occupational performance description: lives alone, impaired cognition  Modification or Assistance to Complete Evaluation  No modification of tasks or assist necessary to complete eval    OT Frequency 2x / week    OT Duration 12 weeks    OT Treatment/Interventions Self-care/ADL training;Cryotherapy;Therapeutic exercise;DME and/or AE instruction;Functional Mobility Training;Cognitive remediation/compensation;Balance training;Neuromuscular education;Manual Therapy;Moist Heat;Manual lymph drainage;Passive range of motion;Therapeutic  activities;Patient/family education    Consulted and Agree with Plan of Care Patient           Patient will benefit from skilled therapeutic intervention in order to improve the following deficits and impairments:   Body Structure / Function / Physical Skills: ADL, Continence, Dexterity, Flexibility, ROM, Strength, Balance, Coordination, FMC, IADL, Endurance, UE functional use, Decreased knowledge of use of DME, GMC, Mobility Cognitive Skills: Attention, Memory, Safety Awareness Psychosocial Skills: Environmental  Adaptations, Habits, Routines and Behaviors   Visit Diagnosis: Muscle weakness (generalized)  Imbalance  Other lack of coordination    Problem List There are no problems to display for this patient.  Achilles Dunk, OTR/L, CLT  Baruch Lewers 09/30/2019, 9:22 AM  [place] MAIN [facility] SERVICES [address] [place], [place], [address] Phone: [phone]   Fax:  [phone]  Name: Karen Ray MRN: [id number] Date of Birth: [birth year]

## 2019-10-03 ENCOUNTER — Ambulatory Visit: Payer: Medicaid Other | Admitting: Physical Therapy

## 2019-10-03 ENCOUNTER — Ambulatory Visit: Payer: Medicaid Other | Admitting: Occupational Therapy

## 2019-10-05 ENCOUNTER — Encounter: Payer: Medicaid Other | Admitting: Physical Therapy

## 2019-10-05 ENCOUNTER — Other Ambulatory Visit: Payer: Self-pay

## 2019-10-05 ENCOUNTER — Ambulatory Visit: Payer: Medicaid Other | Attending: Neurology | Admitting: Occupational Therapy

## 2019-10-05 ENCOUNTER — Ambulatory Visit: Payer: Medicaid Other | Admitting: Physical Therapy

## 2019-10-05 ENCOUNTER — Encounter: Payer: Self-pay | Admitting: Occupational Therapy

## 2019-10-05 DIAGNOSIS — M6281 Muscle weakness (generalized): Secondary | ICD-10-CM | POA: Insufficient documentation

## 2019-10-05 DIAGNOSIS — R293 Abnormal posture: Secondary | ICD-10-CM | POA: Diagnosis present

## 2019-10-05 DIAGNOSIS — R278 Other lack of coordination: Secondary | ICD-10-CM | POA: Insufficient documentation

## 2019-10-05 NOTE — Therapy (Signed)
[place] MAIN [facility] SERVICES [address] [place], [place], [address] Phone: [phone]   Fax:  [phone]  Occupational Therapy Treatment  Patient Details  Name: Karen Ray MRN: [id number] Date of Birth: February 01, 1966 Referring Provider (OT): Karen Kitchens   Encounter Date: 10/05/2019   OT End of Session - 10/05/19 0945    Visit Number 2    Number of Visits 24    Date for OT Re-Evaluation 12/20/19    Authorization Type [employer]    OT Start Time [phone]    OT Stop Time 0953    OT Time Calculation (min) 48 min    Activity Tolerance Patient tolerated treatment well    Behavior During Therapy [facility] for tasks assessed/performed           Past Medical History:  Diagnosis Date  . Hypertension   . Kidney disease, chronic, stage III (moderate, EGFR 30-59 ml/min)     History reviewed. No pertinent surgical history.  There were no vitals filed for this visit.   Subjective Assessment - 10/05/19 0942    Subjective  Patient reports her son and granddaughter has COVID and she is having to rely on others who help with some tasks.    Patient Stated Goals Patient would like to be able to walk again, have good balance and care for self.    Currently in Pain? No/denies    Pain Score 0-No pain    Multiple Pain Sites No          Self Care:   Patient instructed on adaptive equipment to assist with lower body self care tasks.  Use of long handled sponge for lower body dressing with mock demo and instructions on use at home.  Instructed on use of sockaid with therapist demo and cues.  Pt has more difficulty with managing left leg versus right with lower body bathing and dressing.   Therex: Patient seen for strengthening with use of 3# dowel for shoulder flexion, chest press, ABD/ADD, forwards and backwards circles and diagonal patterns.  10 reps for one set each exercise from seated position.  Grip strength 3rd setting, 17# for one set of 20 reps  with left, 2 sets right UE.  Cues for sustained gripping pattern and proper grip on hand gripper. Right coordination slower when placing large pegs into board.    Response to tx:   Patient able to perform strengthening exercise with 3# dowel, attempted 2# but felt it was easy, 3# was a challenge with repetitions.  Patient required cues for proper form and technique.  Patient would likely benefit from a long hand sponge and sock aid at home, able to demo understanding of use in the clinic.  Continue to work towards goals in plan of care to increase independence in daily tasks.                     OT Education - 10/05/19 0944    Education Details strengthening, ROM, coordination    Person(s) Educated Patient    Methods Explanation;Demonstration    Comprehension Verbalized understanding;Returned demonstration               OT Long Term Goals - 09/30/19 0907      OT LONG TERM GOAL #1   Title Patient will improve BUE strength by 1 mm grade to assist with self care tasks and decrease caregiver burden.    Baseline 3/5 overall bilateral UE    Time  12    Period Weeks    Status New    Target Date 12/20/19      OT LONG TERM GOAL #2   Title Patient will complete lower body bathing with min assist and use of adaptive equipment as needed.    Baseline moderate assist to max with bathing at eval    Time 12    Period Weeks    Status New    Target Date 12/20/19      OT LONG TERM GOAL #3   Title Patient will be modified independent with home exercise program for ROM, strength and coordination.    Baseline no current program    Time 12    Period Weeks    Status New    Target Date 12/20/19      OT LONG TERM GOAL #4   Title Patient will perform UB dressing with modified independence.    Baseline min to mod assist at eval    Time 6    Period Weeks    Status New    Target Date 11/08/19      OT LONG TERM GOAL #5   Title Pt will complete lower body dressing with min  assist and adaptive equipment as needed.    Baseline mod to max assist at eval unable to wear shoes and AFO to therapy    Time 12    Period Weeks    Status New    Target Date 12/20/19                 Plan - 10/05/19 0945    Clinical Impression Statement Patient able to perform strengthening exercise with 3# dowel, attempted 2# but felt it was easy, 3# was a challenge with repetitions.  Patient required cues for proper form and technique.  Patient would likely benefit from a long hand sponge and sock aid at home, able to demo understanding of use in the clinic.  Continue to work towards goals in plan of care to increase independence in daily tasks.    OT Occupational Profile and History Detailed Assessment- Review of Records and additional review of physical, cognitive, psychosocial history related to current functional performance    Occupational performance deficits (Please refer to evaluation for details): ADL's;IADL's;Social Participation;Leisure    Body Structure / Function / Physical Skills ADL;Continence;Dexterity;Flexibility;ROM;Strength;Balance;Coordination;FMC;IADL;Endurance;UE functional use;Decreased knowledge of use of DME;GMC;Mobility    Cognitive Skills Attention;Memory;Safety Awareness    Psychosocial Skills Environmental  Adaptations;Habits;Routines and Behaviors    Rehab Potential Good    Clinical Decision Making Limited treatment options, no task modification necessary    Comorbidities Affecting Occupational Performance: Presence of comorbidities impacting occupational performance    Comorbidities impacting occupational performance description: lives alone, impaired cognition    Modification or Assistance to Complete Evaluation  No modification of tasks or assist necessary to complete eval    OT Frequency 2x / week    OT Duration 12 weeks    OT Treatment/Interventions Self-care/ADL training;Cryotherapy;Therapeutic exercise;DME and/or AE instruction;Functional Mobility  Training;Cognitive remediation/compensation;Balance training;Neuromuscular education;Manual Therapy;Moist Heat;Manual lymph drainage;Passive range of motion;Therapeutic activities;Patient/family education    Consulted and Agree with Plan of Care Patient           Patient will benefit from skilled therapeutic intervention in order to improve the following deficits and impairments:   Body Structure / Function / Physical Skills: ADL, Continence, Dexterity, Flexibility, ROM, Strength, Balance, Coordination, FMC, IADL, Endurance, UE functional use, Decreased knowledge of use of DME, GMC, Mobility Cognitive Skills:  Attention, Memory, Safety Awareness Psychosocial Skills: Environmental  Adaptations, Habits, Routines and Behaviors   Visit Diagnosis: Muscle weakness (generalized)  Other lack of coordination  Abnormal posture    Problem List There are no problems to display for this patient.  Achilles Dunk, OTR/L, CLT  Lovett,Amy 10/06/2019, 12:07 PM  [place] MAIN [facility] SERVICES [address] [place], [place], [address] Phone: [phone]   Fax:  [phone]  Name: SANAIA JASSO MRN: [id number] Date of Birth: August 17, 1965

## 2019-10-12 ENCOUNTER — Ambulatory Visit: Payer: Medicaid Other | Admitting: Occupational Therapy

## 2019-10-12 ENCOUNTER — Ambulatory Visit: Payer: Medicaid Other | Admitting: Physical Therapy

## 2019-10-18 ENCOUNTER — Ambulatory Visit: Payer: Medicaid Other | Admitting: Physical Therapy

## 2019-10-18 ENCOUNTER — Ambulatory Visit: Payer: Medicaid Other | Admitting: Occupational Therapy

## 2019-10-20 ENCOUNTER — Ambulatory Visit: Payer: Medicaid Other | Admitting: Physical Therapy

## 2019-10-20 ENCOUNTER — Ambulatory Visit: Payer: Medicaid Other | Admitting: Occupational Therapy

## 2019-10-25 ENCOUNTER — Encounter: Payer: Self-pay | Admitting: Occupational Therapy

## 2019-10-25 ENCOUNTER — Ambulatory Visit: Payer: Medicaid Other | Admitting: Occupational Therapy

## 2019-10-25 ENCOUNTER — Other Ambulatory Visit: Payer: Self-pay

## 2019-10-25 ENCOUNTER — Ambulatory Visit: Payer: Medicaid Other | Admitting: Physical Therapy

## 2019-10-25 DIAGNOSIS — M6281 Muscle weakness (generalized): Secondary | ICD-10-CM

## 2019-10-25 DIAGNOSIS — R278 Other lack of coordination: Secondary | ICD-10-CM

## 2019-10-25 DIAGNOSIS — R293 Abnormal posture: Secondary | ICD-10-CM

## 2019-10-25 NOTE — Therapy (Signed)
[place] MAIN [facility] SERVICES [address] [place], [place], [address] Phone: [phone]   Fax:  [phone]  Occupational Therapy Treatment  Patient Details  Name: Karen Ray MRN: [id number] Date of Birth: 1965/07/03 Referring Provider (OT): Karen Kitchens   Encounter Date: 10/25/2019   OT End of Session - 10/26/19 1551    Visit Number 3    Number of Visits 24    Date for OT Re-Evaluation 12/20/19    Authorization Type [employer]    OT Start Time 3785    OT Stop Time 1100    OT Time Calculation (min) 45 min    Activity Tolerance Patient tolerated treatment well    Behavior During Therapy [facility] for tasks assessed/performed           Past Medical History:  Diagnosis Date  . Hypertension   . Kidney disease, chronic, stage III (moderate, EGFR 30-59 ml/min)     History reviewed. No pertinent surgical history.  There were no vitals filed for this visit.   Subjective Assessment - 10/26/19 1551    Subjective  Patient reports her granddaughter is doing better and has gone back to school.  No pain    Patient Stated Goals Patient would like to be able to walk again, have good balance and care for self.    Currently in Pain? No/denies    Pain Score 0-No pain          Therapeutic Exercise:   Patient seen for strengthening with use of 3# dowel for shoulder flexion, ABD/ADD, chest press, forwards and backwards circles, diagonal patterns for one set of 10 reps, 2nd set with use of 1.5# dowel for 10 reps.  Therapist cues and demo as needed for proper form and technique.    Reaching tasks from w/c position, cues for sitting upright to challenge core strength, use of shape tower with 4 level graduated reaching design.    Resistive Pinch pins all levels, gross grasping with right hand for blue and black levels.   Manipulation of coins from tabletop, cues for prehension patterns, and attempting to use the hand for storage.   Response to  tx:   Patient demonstrates difficulty with 3# dowel this date and able to perform limited repetitions and sets.  Decreased weight to 1.5# and able to complete with cues and therapist demo.  Patient working on core strength with reaching tasks and encouraged for weight shifting during task.  Difficulty with translatory movements of the hand and using the hand for storage.  Continue to work towards goals in plan of care to increase independence in daily tasks.                          OT Education - 10/26/19 1551    Education Details strengthening, ROM, coordination    Person(s) Educated Patient    Methods Explanation;Demonstration    Comprehension Verbalized understanding;Returned demonstration               OT Long Term Goals - 09/30/19 0907      OT LONG TERM GOAL #1   Title Patient will improve BUE strength by 1 mm grade to assist with self care tasks and decrease caregiver burden.    Baseline 3/5 overall bilateral UE    Time 12    Period Weeks    Status New    Target Date 12/20/19      OT LONG TERM GOAL #  2   Title Patient will complete lower body bathing with min assist and use of adaptive equipment as needed.    Baseline moderate assist to max with bathing at eval    Time 12    Period Weeks    Status New    Target Date 12/20/19      OT LONG TERM GOAL #3   Title Patient will be modified independent with home exercise program for ROM, strength and coordination.    Baseline no current program    Time 12    Period Weeks    Status New    Target Date 12/20/19      OT LONG TERM GOAL #4   Title Patient will perform UB dressing with modified independence.    Baseline min to mod assist at eval    Time 6    Period Weeks    Status New    Target Date 11/08/19      OT LONG TERM GOAL #5   Title Pt will complete lower body dressing with min assist and adaptive equipment as needed.    Baseline mod to max assist at eval unable to wear shoes and AFO to  therapy    Time 12    Period Weeks    Status New    Target Date 12/20/19                 Plan - 10/26/19 1552    Clinical Impression Statement Patient demonstrates difficulty with 3# dowel this date and able to perform limited repetitions and sets.  Decreased weight to 1.5# and able to complete with cues and therapist demo.  Patient working on core strength with reaching tasks and encouraged for weight shifting during task.  Difficulty with translatory movements of the hand and using the hand for storage.  Continue to work towards goals in plan of care to increase independence in daily tasks.    OT Occupational Profile and History Detailed Assessment- Review of Records and additional review of physical, cognitive, psychosocial history related to current functional performance    Occupational performance deficits (Please refer to evaluation for details): ADL's;IADL's;Social Participation;Leisure    Body Structure / Function / Physical Skills ADL;Continence;Dexterity;Flexibility;ROM;Strength;Balance;Coordination;FMC;IADL;Endurance;UE functional use;Decreased knowledge of use of DME;GMC;Mobility    Cognitive Skills Attention;Memory;Safety Awareness    Psychosocial Skills Environmental  Adaptations;Habits;Routines and Behaviors    Rehab Potential Good    Clinical Decision Making Limited treatment options, no task modification necessary    Comorbidities Affecting Occupational Performance: Presence of comorbidities impacting occupational performance    Comorbidities impacting occupational performance description: lives alone, impaired cognition    Modification or Assistance to Complete Evaluation  No modification of tasks or assist necessary to complete eval    OT Frequency 2x / week    OT Duration 12 weeks    OT Treatment/Interventions Self-care/ADL training;Cryotherapy;Therapeutic exercise;DME and/or AE instruction;Functional Mobility Training;Cognitive remediation/compensation;Balance  training;Neuromuscular education;Manual Therapy;Moist Heat;Manual lymph drainage;Passive range of motion;Therapeutic activities;Patient/family education    Consulted and Agree with Plan of Care Patient           Patient will benefit from skilled therapeutic intervention in order to improve the following deficits and impairments:   Body Structure / Function / Physical Skills: ADL, Continence, Dexterity, Flexibility, ROM, Strength, Balance, Coordination, FMC, IADL, Endurance, UE functional use, Decreased knowledge of use of DME, GMC, Mobility Cognitive Skills: Attention, Memory, Safety Awareness Psychosocial Skills: Environmental  Adaptations, Habits, Routines and Behaviors   Visit Diagnosis: Muscle weakness (generalized)  Other lack of coordination  Abnormal posture    Problem List There are no problems to display for this patient.  Achilles Dunk, OTR/L, CLT  Roni Scow 10/27/2019, 4:13 PM  [place] MAIN [facility] SERVICES [address] [place], [place], [address] Phone: [phone]   Fax:  [phone]  Name: Karen Ray MRN: [id number] Date of Birth: 1965/04/27

## 2019-10-27 ENCOUNTER — Other Ambulatory Visit: Payer: Self-pay

## 2019-10-27 ENCOUNTER — Ambulatory Visit: Payer: Medicaid Other | Admitting: Occupational Therapy

## 2019-10-27 ENCOUNTER — Ambulatory Visit: Payer: Medicaid Other | Admitting: Physical Therapy

## 2019-10-27 DIAGNOSIS — M6281 Muscle weakness (generalized): Secondary | ICD-10-CM | POA: Diagnosis not present

## 2019-10-27 DIAGNOSIS — R293 Abnormal posture: Secondary | ICD-10-CM

## 2019-10-27 DIAGNOSIS — R278 Other lack of coordination: Secondary | ICD-10-CM

## 2019-10-28 ENCOUNTER — Encounter: Payer: Self-pay | Admitting: Occupational Therapy

## 2019-10-28 NOTE — Therapy (Signed)
[place] MAIN [facility] SERVICES [address] [place], [place], [address] Phone: [phone]   Fax:  [phone]  Occupational Therapy Treatment  Patient Details  Name: Karen Ray MRN: [id number] Date of Birth: 26-Mar-1965 Referring Provider (OT): Karen Kitchens   Encounter Date: 10/27/2019   OT End of Session - 10/28/19 1855    Visit Number 4    Number of Visits 24    Date for OT Re-Evaluation 12/20/19    Authorization Type [employer]    OT Start Time 1015    OT Stop Time 1102    OT Time Calculation (min) 47 min    Activity Tolerance Patient tolerated treatment well    Behavior During Therapy [facility] for tasks assessed/performed           Past Medical History:  Diagnosis Date   Hypertension    Kidney disease, chronic, stage III (moderate, EGFR 30-59 ml/min)     History reviewed. No pertinent surgical history.  There were no vitals filed for this visit.   Subjective Assessment - 10/28/19 1854    Subjective  Patient reports she ordered a long sponge and received it and also got another item which sounds like a back scrubber.  Requested patient bring item in for instruction next session.    Patient Stated Goals Patient would like to be able to walk again, have good balance and care for self.    Currently in Pain? No/denies    Pain Score 0-No pain           Will be assessed for a wheelchair next week, wants to make sure she can still access her bathroom and would like something to keep her legs closed more.   therapeutic Exercise:  Sa[place]o ball tower, 3 levels with right 4 levels with left 3 rounds completed  Neuromuscular reeducation:  Manipulation of small 1/2 inch Snap beads to place together and then separate each piece, cues for prehension patterns.  Difficulty with finger strength to push together and requires assist.  Able to separate without assist.   Manipulation of Coins from flat tabletop to pick up and place into  bank, performing one item at a time.   Difficulty with translatory movements of the hands and using the hand for storage, able to place one coin at a time. Difficulty with pushing quarters into resistive bank   Response to tx:   Patient demonstrates decreased finger strength and has difficulty with attempting to place small beads together.  With coins, she has difficulty with picking up more than one coin and using the hand for storage.  Decreased strength when trying to place quarters into resistive bank.  Continue to work towards goals to increase strength, ROM and coordination and improve hand function for necessary daily tasks to continue to live alone.                       OT Education - 10/28/19 1855    Education Details strengthening, ROM, coordination    Person(s) Educated Patient    Methods Explanation;Demonstration    Comprehension Verbalized understanding;Returned demonstration               OT Long Term Goals - 09/30/19 0907      OT LONG TERM GOAL #1   Title Patient will improve BUE strength by 1 mm grade to assist with self care tasks and decrease caregiver burden.    Baseline 3/5 overall bilateral UE  Time 12    Period Weeks    Status New    Target Date 12/20/19      OT LONG TERM GOAL #2   Title Patient will complete lower body bathing with min assist and use of adaptive equipment as needed.    Baseline moderate assist to max with bathing at eval    Time 12    Period Weeks    Status New    Target Date 12/20/19      OT LONG TERM GOAL #3   Title Patient will be modified independent with home exercise program for ROM, strength and coordination.    Baseline no current program    Time 12    Period Weeks    Status New    Target Date 12/20/19      OT LONG TERM GOAL #4   Title Patient will perform UB dressing with modified independence.    Baseline min to mod assist at eval    Time 6    Period Weeks    Status New    Target Date 11/08/19       OT LONG TERM GOAL #5   Title Pt will complete lower body dressing with min assist and adaptive equipment as needed.    Baseline mod to max assist at eval unable to wear shoes and AFO to therapy    Time 12    Period Weeks    Status New    Target Date 12/20/19                 Plan - 10/28/19 1855    Clinical Impression Statement Patient demonstrates decreased finger strength and has difficulty with attempting to place small beads together.  With coins, she has difficulty with picking up more than one coin and using the hand for storage.  Decreased strength when trying to place quarters into resistive bank.  Continue to work towards goals to increase strength, ROM and coordination and improve hand function for necessary daily tasks to continue to live alone.    OT Occupational Profile and History Detailed Assessment- Review of Records and additional review of physical, cognitive, psychosocial history related to current functional performance    Occupational performance deficits (Please refer to evaluation for details): ADL's;IADL's;Social Participation;Leisure    Body Structure / Function / Physical Skills ADL;Continence;Dexterity;Flexibility;ROM;Strength;Balance;Coordination;FMC;IADL;Endurance;UE functional use;Decreased knowledge of use of DME;GMC;Mobility    Cognitive Skills Attention;Memory;Safety Awareness    Psychosocial Skills Environmental  Adaptations;Habits;Routines and Behaviors    Rehab Potential Good    Clinical Decision Making Limited treatment options, no task modification necessary    Comorbidities Affecting Occupational Performance: Presence of comorbidities impacting occupational performance    Comorbidities impacting occupational performance description: lives alone, impaired cognition    Modification or Assistance to Complete Evaluation  No modification of tasks or assist necessary to complete eval    OT Frequency 2x / week    OT Duration 12 weeks    OT  Treatment/Interventions Self-care/ADL training;Cryotherapy;Therapeutic exercise;DME and/or AE instruction;Functional Mobility Training;Cognitive remediation/compensation;Balance training;Neuromuscular education;Manual Therapy;Moist Heat;Manual lymph drainage;Passive range of motion;Therapeutic activities;Patient/family education    Consulted and Agree with Plan of Care Patient           Patient will benefit from skilled therapeutic intervention in order to improve the following deficits and impairments:   Body Structure / Function / Physical Skills: ADL, Continence, Dexterity, Flexibility, ROM, Strength, Balance, Coordination, FMC, IADL, Endurance, UE functional use, Decreased knowledge of use of DME, GMC, Mobility Cognitive Skills:  Attention, Memory, Safety Awareness Psychosocial Skills: Environmental  Adaptations, Habits, Routines and Behaviors   Visit Diagnosis: Muscle weakness (generalized)  Other lack of coordination  Abnormal posture    Problem List There are no problems to display for this patient.  Achilles Dunk, OTR/L, CLT  Devin Ganaway 10/28/2019, 7:02 PM  [place] MAIN [facility] SERVICES [address] [place], [place], [address] Phone: [phone]   Fax:  [phone]  Name: Karen Ray MRN: [id number] Date of Birth: Jul 13, 1965

## 2019-10-31 ENCOUNTER — Encounter: Payer: Self-pay | Admitting: Occupational Therapy

## 2019-10-31 ENCOUNTER — Ambulatory Visit: Payer: Medicaid Other | Admitting: Occupational Therapy

## 2019-10-31 ENCOUNTER — Other Ambulatory Visit: Payer: Self-pay

## 2019-10-31 DIAGNOSIS — M6281 Muscle weakness (generalized): Secondary | ICD-10-CM | POA: Diagnosis not present

## 2019-10-31 DIAGNOSIS — R278 Other lack of coordination: Secondary | ICD-10-CM

## 2019-10-31 NOTE — Therapy (Signed)
[place] MAIN [facility] SERVICES [address] [place], [place], [address] Phone: [phone]   Fax:  [phone]  Occupational Therapy Treatment  Patient Details  Name: Karen Ray MRN: [id number] Date of Birth: 1965/12/23 Referring Provider (OT): Karen Kitchens   Encounter Date: 10/31/2019   OT End of Session - 10/31/19 1137    Visit Number 5    Number of Visits 24    Date for OT Re-Evaluation 12/20/19    Authorization Type [employer]    OT Start Time 1104    OT Stop Time 1145    OT Time Calculation (min) 41 min    Activity Tolerance Patient tolerated treatment well    Behavior During Therapy [facility] for tasks assessed/performed           Past Medical History:  Diagnosis Date  . Hypertension   . Kidney disease, chronic, stage III (moderate, EGFR 30-59 ml/min)     History reviewed. No pertinent surgical history.  There were no vitals filed for this visit.   Subjective Assessment - 10/31/19 1134    Subjective  Pt. reports that she oredered A/E equipment and is waiting for some of it to arrive.    Patient is accompanied by: Family member    Patient Stated Goals Patient would like to be able to walk again, have good balance and care for self.    Currently in Pain? No/denies    Pain Score 0-No pain    Pain Orientation Left    Pain Descriptors / Indicators Aching          OT TREATMENT   Therapeutic Exercise:  Pt. performed bilateral UE strengthening using a 3# dowel ex. for UE strengthening secondary to weakness. Bilateral shoulder flexion, chest press, horizontal V, and  circular patterns were performed.  Selfcare:  Pt. education was provided about A/E for self-care. Pt. brought in a horizontal back scrubber from home for review and practive, however with length of the scrubber is too short for her to effectively use it. Pt. Education was provided about A/E options for bathing.  Pt. Reports that he has a bath brush, however is  thinking about obtaining a flexible LH sponge.  Pt. tolerated the exercises well overall. Pt. Attempted to use the scrubber that she brought in, however was unable to place her UEs in the proper position to use it effectively. Pt. has a w/c evaluation next week. Plan to hold OT services at this time, and resume treatment once the patient receives her new chair to review toileting transfers, and functional kitchen mobility.                                                   OT Education - 10/31/19 1137    Education Details strengthening, ROM, coordination    Person(s) Educated Patient    Methods Explanation;Demonstration    Comprehension Verbalized understanding;Returned demonstration               OT Long Term Goals - 09/30/19 0907      OT LONG TERM GOAL #1   Title Patient will improve BUE strength by 1 mm grade to assist with self care tasks and decrease caregiver burden.    Baseline 3/5 overall bilateral UE    Time 12    Period Weeks    Status  New    Target Date 12/20/19      OT LONG TERM GOAL #2   Title Patient will complete lower body bathing with min assist and use of adaptive equipment as needed.    Baseline moderate assist to max with bathing at eval    Time 12    Period Weeks    Status New    Target Date 12/20/19      OT LONG TERM GOAL #3   Title Patient will be modified independent with home exercise program for ROM, strength and coordination.    Baseline no current program    Time 12    Period Weeks    Status New    Target Date 12/20/19      OT LONG TERM GOAL #4   Title Patient will perform UB dressing with modified independence.    Baseline min to mod assist at eval    Time 6    Period Weeks    Status New    Target Date 11/08/19      OT LONG TERM GOAL #5   Title Pt will complete lower body dressing with min assist and adaptive equipment as needed.    Baseline mod to max assist at eval unable to wear shoes and  AFO to therapy    Time 12    Period Weeks    Status New    Target Date 12/20/19                 Plan - 10/31/19 1137    Clinical Impression Statement Pt. tolerated the exercises well overall. Pt. Attempted to use the scrubber that she brought in, however was unable to place her UEs in the proper position to use it effectively. Pt. has a w/c evaluation next week. Plan to hold OT services at this time, and resume treatment once the patient receives her new chair to review toileting transfers, and functional kitchen mobility.    OT Occupational Profile and History Detailed Assessment- Review of Records and additional review of physical, cognitive, psychosocial history related to current functional performance    Occupational performance deficits (Please refer to evaluation for details): ADL's;IADL's;Social Participation;Leisure    Body Structure / Function / Physical Skills ADL;Continence;Dexterity;Flexibility;ROM;Strength;Balance;Coordination;FMC;IADL;Endurance;UE functional use;Decreased knowledge of use of DME;GMC;Mobility    Cognitive Skills Attention;Memory;Safety Awareness    Psychosocial Skills Environmental  Adaptations;Habits;Routines and Behaviors    Rehab Potential Good    Clinical Decision Making Limited treatment options, no task modification necessary    Comorbidities Affecting Occupational Performance: Presence of comorbidities impacting occupational performance    Comorbidities impacting occupational performance description: lives alone, impaired cognition    Modification or Assistance to Complete Evaluation  No modification of tasks or assist necessary to complete eval    OT Frequency 2x / week    OT Duration 12 weeks    OT Treatment/Interventions Self-care/ADL training;Cryotherapy;Therapeutic exercise;DME and/or AE instruction;Functional Mobility Training;Cognitive remediation/compensation;Balance training;Neuromuscular education;Manual Therapy;Moist Heat;Manual lymph  drainage;Passive range of motion;Therapeutic activities;Patient/family education    Consulted and Agree with Plan of Care Patient           Patient will benefit from skilled therapeutic intervention in order to improve the following deficits and impairments:   Body Structure / Function / Physical Skills: ADL, Continence, Dexterity, Flexibility, ROM, Strength, Balance, Coordination, FMC, IADL, Endurance, UE functional use, Decreased knowledge of use of DME, GMC, Mobility Cognitive Skills: Attention, Memory, Safety Awareness Psychosocial Skills: Environmental  Adaptations, Habits, Routines and Behaviors   Visit Diagnosis:  Muscle weakness (generalized)  Other lack of coordination    Problem List There are no problems to display for this patient.   Harrel Carina, MS, OTR/L 10/31/2019, 12:53 PM  [place] MAIN [facility] SERVICES [address] [place], [place], [address] Phone: [phone]   Fax:  [phone]  Name: Karen Ray MRN: [id number] Date of Birth: Apr 28, 1965

## 2019-11-09 ENCOUNTER — Encounter: Payer: Self-pay | Admitting: Physical Therapy

## 2019-11-09 ENCOUNTER — Other Ambulatory Visit: Payer: Self-pay

## 2019-11-09 ENCOUNTER — Ambulatory Visit: Payer: Medicaid Other | Attending: Family Medicine | Admitting: Physical Therapy

## 2019-11-09 DIAGNOSIS — M6281 Muscle weakness (generalized): Secondary | ICD-10-CM | POA: Diagnosis not present

## 2019-11-09 DIAGNOSIS — R262 Difficulty in walking, not elsewhere classified: Secondary | ICD-10-CM | POA: Diagnosis present

## 2019-11-09 NOTE — Therapy (Addendum)
[place] MAIN [facility] SERVICES [address] [place], [place], [address] Phone: [phone]   Fax:  [phone]  Physical Therapy Evaluation  Patient Details  Name: Karen Ray MRN: [id number] Date of Birth: 05/01/1965 Referring Provider (PT): Dr. Margart Sickles   Encounter Date: 11/09/2019   PT End of Session - 11/09/19 1022    Visit Number 1    Number of Visits 1    PT Start Time 1015    PT Stop Time 1130    PT Time Calculation (min) 75 min           Past Medical History:  Diagnosis Date  . Hypertension   . 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 No pertinent surgical history.  There were no vitals filed for this visit.    Subjective Assessment - 11/09/19 1021    Subjective Patient needs a power chair           PATIENT INFORMATION: This Evaluation form will serve as the LMN for the following suppliers:  Supplier:[employer]  Contact Person: Deberah Pelton Phone:[phone]   Reason for Referral:To get a power wheelchair Patient/caregiver Goals: to be able to be independent with ADL's in her home Patient was seen for face-to-face evaluation for new power wheelchair.  Also present was Karen Ray             to discuss recommendations and wheelchair options.  Further paperwork was completed and sent to vendor.  Patient appears to qualify for power mobility device at this time per objective findings.   MEDICAL HISTORY: Diagnosis: weakness, edema, foot drop Primary Diagnosis Onset: [] Progressive Disease Relevant Past and Future Surgeries: 2015 R foot casted Height: 5 foot 3 inches Weight: 230 lbs Explain and recent changes or trends in weight: It changes from 220 to 230   Relevant History including falls: she has had a fall 2 months ago       HOME ENVIRONMENT: [] House  [] Condo/town home  [x] Apartment  [] Assisted Living    [x] Lives Alone []  Lives with  Others                                                    Hours with caregiver: 5 days / week for 2 hours . day  [x] Home is accessible to patient            Stairs  [] Yes [x]  No     Ramp [] Yes [x] No Comments:     COMMUNITY ADL: TRANSPORTATION: [] Car    [] Van    [x] Public Transportation    [x] Adapted w/c Lift   [] Ambulance   [] Other:       [] Sits in wheelchair during transport  Employment/School:     Specific requirements pertaining to mobility                                                     Other:    NA                                   FUNCTIONAL/SENSORY PROCESSING  SKILLS:  Handedness:   [x] Right     [] Left    [] NA  Comments:                                 Functional Processing Skills for Wheeled Mobility [x] Processing Skills are adequate for safe wheelchair operation  Areas of concern than may interfere with safe operation of wheelchair Description of problem   []  Attention to environment     [] Judgment     []  Hearing  []  Vision or visual processing    [] Motor Planning  []  Fluctuations in Behavior                                                   VERBAL COMMUNICATION: [x] WFL receptive [x]  WFL expressive [x] Understandable  [] Difficult to understand  [] non-communicative []  Uses an augmented communication device    CURRENT SEATING / MOBILITY: Current Mobility Base:   [] None  [] Dependent  [] Manual  [x] Scooter  [] Power   Type of Control:                       Manufacturer:   Hover round                      Size:         20 x 20                Age:         [age]                  Current Condition of Mobility Base:    Patient is too large for her chair and is slipping out.                                                                                                                 Current Wheelchair components:                                                                                                                                   Describe posture in  present seating system:     Positionally the hover round does not support her needs  SENSATION and SKIN ISSUES: Sensation $RemoveBeforeDEI'[]'JCyYWMNhDYvpefDc$ Intac'[x]'$ Impaired $RemoveB'[]'ishdOfrV$ Absent   Level of sensation:   BLE sensation is impaired                        Pressure Relief: Able to perform effective pressure relief :   '[]'$ Yes  $Re'[x]'Aiz$  No Method:                                                                              If not, Why?:    She does not have UE strength ot raise herself up in her chair                                                                      Skin Issues/Skin Integrity Current Skin Issues   '[]'$ Yes $R'[x]'xb$ No  $R'[x]'OD$ Intac'[]'$  Red area $Remove'[]'YKGpAjt$  Open Area  $Rem'[]'asBx$ Scar Tissue $RemoveBef'[]'gPUyqpPNps$ At risk from prolonged sitting  Where                              History of Skin Issues   '[x]'$ Yes $Re'[]'rDE$ No  Where    Bottom                                      When    6 months ago                                           Hx of skin flap surgeries $RemoveBefore'[]'cGCMRJDjmNqir$ Ye'[x]'$ No  Where                  When                                                  Limited sitting tolerance $RemoveBeforeDEI'[]'wsAYqIdNTNgimogu$ Ye'[x]'$ No Hours spent sitting in wheelchair daily:   12 hours / day                                                     Complaint of Pain:  Please describe:    No reports of pain.  Swelling/Edema:     BLE feet                                                                                                                                             ADL STATUS (in reference to wheelchair use):  Indep Assist Unable Indep with Equip Not assessed Comments  Dressing                  x                                                       Eating     x                                                                                                                         Toileting                                                        x                                                                        Bathing                x  Grooming/ Hygiene                  x                                                                                                            Meal Prep                   x                                                                                                       IADLS                 x                                                                                                 Bowel Management: [] Continent  [] Incontinent  [x] Accidents Comments:                                                  Bladder Management: [] Continent  [] Incontinent  [x] Accidents Comments:                                              WHEELCHAIR SKILLS: Manual w/c Propulsion: Unable due to BUE weakness [] UE or LE strength and endurance sufficient to participate in ADLs using manual wheelchair Arm :  [] left [] right  [] Both                                   Foot:   [] left [] right  [] Both  Distance:   Operate Scooter: []  Strength, hand grip, balance and transfer appropriate for use [] Living environment is accessible for use of scooter: Patient is not able to transfer on and off of scooter to any surface  Operate Power w/c:  [x]  Std. Joystick   []  Alternative Controls Indep [x]  Assist []   Dependent/ Unable []  N/A []  [] Safe          []  Functional      Distance:                Bed confined without wheelchair []  Yes []  No   STRENGTH/RANGE OF MOTION:  Range of Motion Strength  Shoulder           WFL                                                           3/5                                       Elbow                WFL                                                        3/5                                          Wrist/Hand                                                                                                                            Grip right hand 18, grip on left hand 20 pounds              Hip         impaired  , R hip is in ER                                                                                                                0/5 hip flex, hip abd  Knee  WFL                      0/5 knee flex, knee ext                                                    Ankle Impaired R ankle is in IR                                                                  0/5 B ankle DF / PF      MOBILITY/BALANCE:  [x]  Patient is totally dependent for mobility                                                                                               Balance Transfers Ambulation  Sitting Balance: Standing Balance: []  Independent []  Independent/Modified Independent  []  WFL     []  [facility] []  Supervision []  Supervision  []  Uses UE for balance  []  Supervision []  Min Assist []  Ambulates with Assist                           []  Min Assist []  Min assist []  Mod Assist []  Ambulates with Device:  []  RW   []  StW   []  Cane   []                 []  Mod Assist []  Mod assist []  Max assist   [x]  Max Assist []  Max assist [x]  Dependent []  Indep. Short Distance Only  []  Unable [x]  Unable []  Lift / Sling Required Distance (in feet)                             []  Sliding board [x]  Unable to Ambulate: (Explain:  Cardio Status:  [x] Intact  []  Impaired   []  NA                              Respiratory Status:  [x] Intact   [] Impaired   [] NA                                     Orthotics/Prosthetics:  NA  Comments (Address manual vs power w/c vs scooter):        She is unable to propel a manual WC due to weakness in BUE, She is not safe in hover round scoots out of her hoverround due to weakness in trunk and BLE.                                                        Anterior / Posterior Obliquity Rotation-Pelvis  PELVIS    [] Neutral  [x]  Posterior  []  Anterior     [] WFL  [] Right Elevated  [x] Left Elevated   [] WFL  [] Right Anterior [x]   Left Anterior    []  Fixed [x]  Partly Flexible []  Flexible  []  Other  []  Fixed  [x]  Partly Flexible  []  Flexible []  Other  []  Fixed  [x]  Partly Flexible  []  Flexible []  Other  TRUNK [x] WFL [] Thoracic Kyphosis [] Lumbar Lordosis   [x]  WFL [] Convex Right [] Convex Left   [] c-curve [] s-curve [] multiple  [x]  Neutral []  Left-anterior []  Right-anterior    []  Fixed []  Flexible []  Partly Flexible       Other  []  Fixed []  Flexible []  Partly Flexible []  Other  []  Fixed           []  Flexible []  Partly Flexible []  Other   Position Windswept   HIPS  []  Neutral [x]  Abduct []  ADduct []  Neutral [x]  Right []  Left       []  Fixed  [x]  Partly Flexible             []  Dislocated []  Flexible []  Subluxed    []  Fixed [x]  Partly Flexible  []  Flexible []  Other              Foot Positioning Knee Positioning   Knees and  Feet  [x]  WFL [] Left [] Right [x]  WFL [] Left [] Right   KNEES ROM concerns: ROM concerns:   & Dorsi-Flexed                    [] Lt [] Rt                                  FEET Plantar Flexed                  [] Lt [] Rt     Inversion                    [] Lt [x] Rt     Eversion                    [] Lt [] Rt    HEAD [x]  Functional [x]  Good Head Control   & []  Flexed         []  Extended [x]  Adequate Head Control   NECK []  Rotated  Lt  []  Lat Flexed Lt []  Rotated  Rt []  Lat Flexed Rt []  Limited Head Control    []  Cervical Hyperextension []  Absent  Head Control    SHOULDERS ELBOWS WRIST& HAND         Left     Right    Left     Right  U/E [x] Functional  Left            [x] Functional  Right                                 []   Fisting             [] Fisting     [] elevated Left [] depressed  Left [] elevated Right [] depressed  Right       [] protracted Left [] retracted Left [] protracted Right [] retracted Right [] subluxed  Left              [] subluxed  Right         Goals for Wheelchair Mobility  [x]  Independence with mobility in the home with motor related ADLs (MRADLs)  [x]  Independence with MRADLs in the community [x]  Provide dependent mobility  [x]  Provide recline     [x] Provide tilt   Goals for Seating system [x]  Optimize pressure distribution [x]  Provide support needed to facilitate function or safety [x]  Provide corrective forces to assist with maintaining or improving posture [x]  Accommodate client's posture: current seated postures and positions are not flexible or will not tolerate corrective forces [x]  Client to be independent with relieving pressure in the wheelchair [x] Enhance physiological function such as breathing, swallowing, digestion  Simulation ideas/Equipment trials:                                                                                                State why other equipment was unsuccessful:    Patient is not able to move her LE's, She has weak trunk musculature and is not able to stay safely seated in her hover round.                                                                              MOBILITY BASE RECOMMENDATIONS and JUSTIFICATION: MOBILITY COMPONENT JUSTIFICATION  Manufacturer: [employer]obil          Model:  M3            Size: Width      21 wide x 20 deep     Seat Depth             [x] provide transport from point A to B [] promote Indep mobility  [x] is not a safe, functional ambulator [x] walker or cane inadequate [x] non-standard width/depth necessary to accommodate anatomical measurement []                             [] Manual Mobility Base [] non-functional ambulator    [] Scooter/POV  [] can safely operate  [] can safely transfer   [] has adequate trunk stability  [] cannot functionally propel manual w/c  [x] Power Mobility Base  [x] non-ambulatory  [x] cannot functionally  propel manual wheelchair  [x]  cannot functionally and safely operate scooter/POV [x] can safely operate and willing to  [] Stroller Base [] infant/child  [] unable to propel manual wheelchair [] allows for growth [] non-functional ambulator [] non-functional UE [] Indep mobility is not a goal at this time  [x] Tilt  [] Forward                   [  x]Backward                  [x] Powered tilt              [] Manual tilt  [x] change position against gravitational force on head and shoulders  [x] change position for pressure relief/cannot weight shift [x] transfers  [] management of tone [x] rest periods [x] control edema [x] facilitate postural control  []                                       [x] Recline  [x] Power recline on power base [] Manual recline on manual base  [x] accommodate femur to back angle  [x] bring to full recline for ADL care  [x] change position for pressure relief/cannot weight shift [x] rest periods [x] repositioning for transfers or clothing/diaper /catheter changes [] head positioning  [] Lighter weight required [] self- propulsion  [] lifting []                                                 [] Heavy Duty required [] user weight greater than 250# [] extreme tone/ over active movement [] broken frame on previous chair []                                     [x]  Back  [x]  Angle Adjustable []  Custom molded                           [x] postural control [x] control of tone/spasticity [x] accommodation of range of motion [x] UE functional control [x] accommodation for seating system []                                          [x] provide lateral trunk support [] accommodate deformity [x] provide posterior trunk support [] provide lumbar/sacral support [x] support trunk in midline [x] Pressure relief over spinal processes  [x]  Seat Cushion                       [x] impaired sensation  [] decubitus ulcers present [x] history of pressure ulceration [x] prevent pelvic extension [x] low maintenance   [x] stabilize pelvis  [x] accommodate obliquity [] accommodate multiple deformity [x] neutralize lower extremity position [x] increase pressure distribution []                                           [x]  Pelvic/thigh support  [x]  Lateral thigh guide []  Distal medial pad  []  Distal lateral pad [x]  pelvis in neutral [x] accommodate pelvis [x]  position upper legs []  alignment [x]  accommodate ROM []  decrease adduction [] accommodate tone [x] removable for transfers [x] decrease abduction  []  Lateral trunk Supports []  Lt     []  Rt [] decrease lateral trunk leaning [] control tone [] contour for increased contact [] safety  [] accommodate asymmetry []                                                 []  Mounting hardware  [] lateral trunk supports  [] back   []   seat [x] headrest      [x]  thigh support [] fixed   [x] swing away [x] attach seat platform/cushion to w/c frame [x] attach back cushion to w/c frame [x] mount postural supports [x] mount headrest  [x] swing medial thigh support away [] swing lateral supports away for transfers  []                                                     Armrests  [] fixed [x] adjustable height [] removable   [] swing away  [x] flip back   [] reclining [x] full length pads [] desk    [] pads tubular  [x] provide support with elbow at 90   [x] provide support for w/c tray [x] change of height/angles for variable activities [x] remove for transfers [x] allow to come closer to table top [x] remove for access to tables []                                               Hangers/ Leg rests  [] 60 [] 70 [] 90 [x] elevating [] heavy duty  [x] articulating [] fixed [] lift off [] swing away     [x] power [x] provide LE support  [] accommodate to hamstring tightness [x] elevate legs during recline   [x] provide change in position for Legs [x] Maintain placement of feet on footplate [x] durability [x] enable transfers [x] decrease edema [x] Accommodate lower leg length []                                          Foot support Footplate    [] Lt  []  Rt  [x]  Center mount [x] flip up                            [] depth/angle adjustable [] Amputee adapter    []  Lt     []  Rt [x] provide foot support [x] accommodate to ankle ROM [x] transfers [] Provide support for residual extremity []  allow foot to go under wheelchair base []  decrease tone  []                                                 []  Ankle strap/heel loops [] support foot on foot support [] decrease extraneous movement [] provide input to heel  [] protect foot  Tires: [] pneumatic  [x] flat free inserts  [] solid  [x] decrease maintenance  [] prevent frequent flats [] increase shock absorbency [] decrease pain from road shock [] decrease spasms from road shock []                                              [x]  Headrest  [x] provide posterior head support [x] provide posterior neck support [] provide lateral head support [] provide anterior head support [x] support during tilt and recline [] improve feeding   [] improve respiration [] placement of switches [x] safety  [] accommodate ROM  [] accommodate tone [] improve visual orientation  []  Anterior chest strap []  Vest []  Shoulder retractors  [] decrease forward movement of shoulder [] accommodation of TLSO [] decrease forward movement of trunk [] decrease shoulder elevation [] added abdominal support [] alignment [] assistance  with shoulder control  []                                               Pelvic Positioner [x] Belt [] SubASIS bar [] Dual Pull [] stabilize tone [x] decrease falling out of chair/ **will not Decrease potential for sliding due to pelvic tilting [x] prevent excessive rotation [] pad for protection over boney prominence [] prominence comfort [] special pull angle to control rotation []                                                  Upper ExtremitySupport  [] L   []  R [] Arm trough   [] hand support []  tray       [x] full tray [] swivel mount [] decrease edema      [] decrease subluxation    [] control tone   [x] placement for AAC/Computer/EADL [x] decrease gravitational pull on shoulders [] provide midline positioning [] provide support to increase UE function [] provide hand support in natural position [x] provide work surface   POWER WHEELCHAIR CONTROLS  [x] Proportional  [] Non-Proportional Type                                      [] Left  [x] Right [x] provides access for controlling wheelchair   [] lacks motor control to operate proportional drive control [] unable to understand proportional controls  Actuator Control Module  [] Single  [x] Multiple   [x] Allow the client to operate the power seat function(s) through the joystick control   [] Safety Reset Switches [] Used to change modes and stop the wheelchair when driving in latch mode    [] Upgraded Electronics   [] programming for accurate control [] progressive Disease/changing condition [] non-proportional drive control needed [] Needed in order to operate power seat functions through joystick control   [] Display box [] Allows user to see in which mode and drive the wheelchair is set  [] necessary for alternate controls    [] Digital interface electronics [] Allows w/c to operate when using alternative drive controls  [] ASL Head Array [] Allows client to operate wheelchair  through switches placed in tri-panel headrest  [] Sip and puff with tubing kit [] needed to operate sip and puff drive controls  [] Upgraded tracking electronics [] increase safety when driving [] correct tracking when on uneven surfaces  [x] Mount for switches or joystick [x] Attaches switches to w/c  [x] Swing away for access or transfers [] midline for optimal placement [x] provides for consistent access  [] Attendant controlled joystick plus mount [] safety [] long distance driving [] operation of seat functions [] compliance with transportation regulations []                                             Rear wheel placement/Axle adjustability [] None [] semi  adjustable [] fully adjustable  [] improved UE access to wheels [] improved stability [] changing angle in space for improvement of postural stability [] 1-arm drive access [] amputee pad placement []                                Wheel rims/ hand rims  [] metal   [] plastic coated [] oblique projections           []   vertical projections [] Provide ability to propel manual wheelchair  []  Increase self-propulsion with hand weakness/decreased grasp  Push handles [] extended   [] angle adjustable              [] standard [] caregiver access [] caregiver assist [] allows "hooking" to enable increased ability to perform ADLs or maintain balance  One armed device   [] Lt   [] Rt [] enable propulsion of manual wheelchair with one arm   []                                            Brake/wheel lock extension []  Lt   []  Rt [] increase indep in applying wheel locks   [] Side guards [] prevent clothing getting caught in wheel or becoming soiled []  prevent skin tears/abrasions  Battery:    Group 22 x 2                                         [x] to power wheelchair                                                         Other:                                                                                                                        The above equipment has a life- long use expectancy. Growth and changes in medical and/or functional conditions would be the exceptions. This is to certify that the therapist has no financial relationship with durable medical provider or manufacturer. The therapist will not receive remuneration of any kind for the equipment recommended in this evaluation.   Patient has mobility limitation that significantly impairs safe, timely participation in one or more mobility related ADL's. (bathing, toileting, feeding, dressing, grooming, moving from room to room)  [x]  Yes []  No  Will mobility device sufficiently improve ability to participate and/or be aided in participation of MRADL's?      [x]   Yes []  No  Can limitation be compensated for with use of a cane or walker?                                    []  Yes [x]  No  Does patient or caregiver demonstrate ability/potential ability & willingness to safely use the mobility device?    [x]  Yes []  No  Does patient's home environment support use of recommended mobility device?            [x]  Yes []  No  Does patient have sufficient upper extremity function  necessary to functionally propel a manual wheelchair?     []  Yes [x]  No  Does patient have sufficient strength and trunk stability to safely operate a POV (scooter)?                                  []  Yes [x]  No  Does patient need additional features/benefits provided by a power wheelchair for MRADL's in the home?        [x]  Yes []  No  Does the patient demonstrate the ability to safely use a power wheelchair?                   [x]  Yes []  No     Physician's Name Printed:   Dr. Delight Stare                                                     Physician's Signature:  Date:     This is to certify that I, the above signed therapist have the following affiliations: []  This DME provider []  Manufacturer of recommended equipment []  Patient's long term care facility [x]  None of the above  Therapist Name/Signature:    Arelia Sneddon PT DPT                                        Date:11/09/19                Objective measurements completed on examination: See above findings.                    PT Long Term Goals - 11/09/19 1117      PT LONG TERM GOAL #1   Title Pt and caregivers will understand PT recommendation and appropriate/safe use for wheelchair and seating for home use.    Time 1    Period Days    Status Achieved    Target Date 11/09/19      PT LONG TERM GOAL #2       Baseline --    Time --    Period --    Status --      PT LONG TERM GOAL #3   Title --    Baseline --    Time --    Period --    Status --      PT LONG TERM GOAL  #4   Title --    Baseline --    Time --    Period --    Status --      PT LONG TERM GOAL #5   Title --    Baseline --    Time --    Period --    Status --      PT LONG TERM GOAL #6   Title --    Baseline --    Time --    Period --    Status --                  Plan - 11/09/19 1117    Clinical Impression Statement Patient is here today to get a power WC for better safety and mobility in her home.  Personal Factors and Comorbidities Transportation    Examination-Activity Limitations Bathing;Bed Mobility;Dressing;Hygiene/Grooming;Lift;Squat;Stairs;Stand;Transfers    Stability/Clinical Decision Making Unstable/Unpredictable    Rehab Potential Fair    PT Frequency 2x / week    PT Duration 4 weeks    PT Treatment/Interventions ADLs/Self Care Home Management;Electrical Stimulation;Gait training;Therapeutic activities;Functional mobility training;Stair training;Therapeutic exercise;Balance training;Neuromuscular re-education;Patient/family education;Orthotic Fit/Training;Passive range of motion;Manual techniques    PT Next Visit Plan Transfer training.  Progress HEP (supine based ex.).    PT Home Exercise Plan [id number]    Consulted and Agree with Plan of Care Patient           Patient will benefit from skilled therapeutic intervention in order to improve the following deficits and impairments:  Abnormal gait, Decreased endurance, Decreased activity tolerance, Decreased balance, Decreased coordination, Difficulty walking, Postural dysfunction, Decreased strength, Decreased mobility, Improper body mechanics, Decreased range of motion  Visit Diagnosis: Muscle weakness (generalized)  Difficulty in walking, not elsewhere classified     Problem List There are no problems to display for this patient.   [address] [place] DPT 11/09/2019, 11:22 AM  [place] [facility] [address] [place], [place],  [address] Phone: [phone]   Fax:  [phone]  Name: Karen Ray MRN: [id number] Date of Birth: 25-Jan-1966

## 2020-11-08 IMAGING — MR MR HEAD W/O CM
6 of 8 series · 36 of 48 positions shown · non-contrast
Comparison: None.

CLINICAL DATA: Right leg weakness.

EXAM:
MRI HEAD WITHOUT CONTRAST
TECHNIQUE: Multiplanar, multiecho pulse sequences of the brain and surrounding
structures were obtained without intravenous contrast.

[Series 5: ax dwi_tracew · axial · 3.0mm · 0.60mm/px · z∈[-116,+33]mm · 8 of 48 slices shown]
[im 1/48]
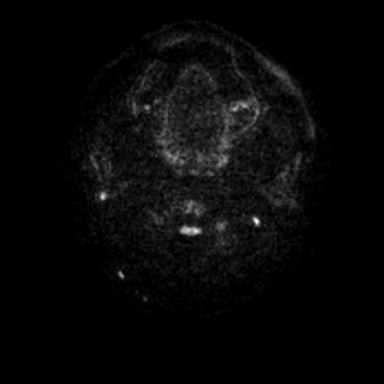
[im 7/48]
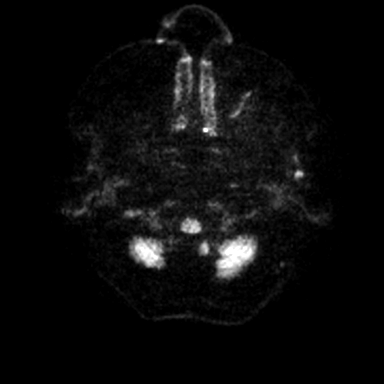
[im 14/48]
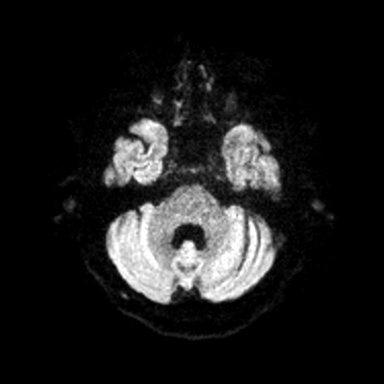
[im 21/48]
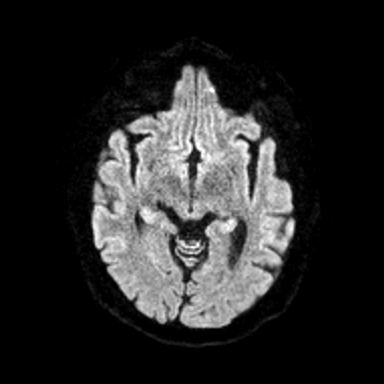
[im 27/48]
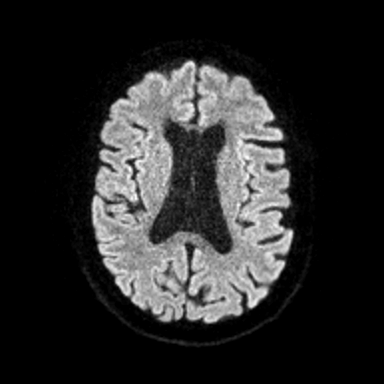
[im 34/48]
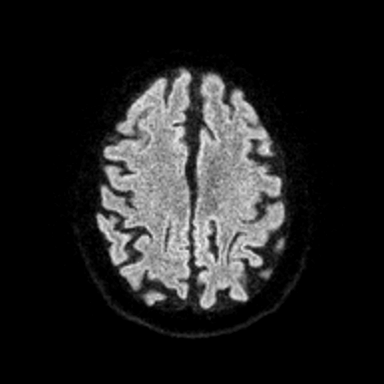
[im 41/48]
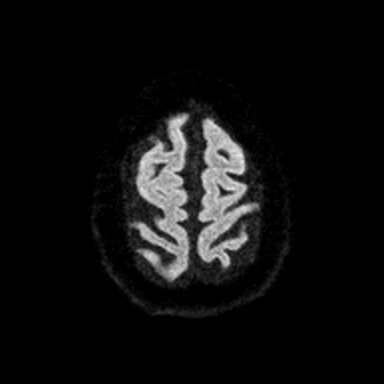
[im 48/48]
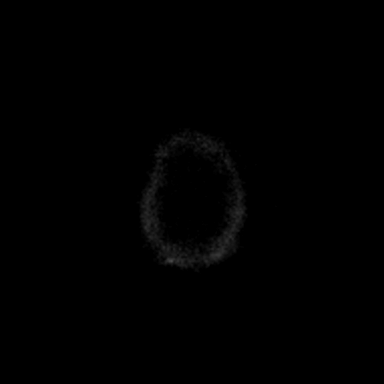

[Series 6: ax dwi_adc · axial · 3.0mm · 0.60mm/px · z∈[-116,+33]mm · 8 of 48 slices shown]
[im 1/48]
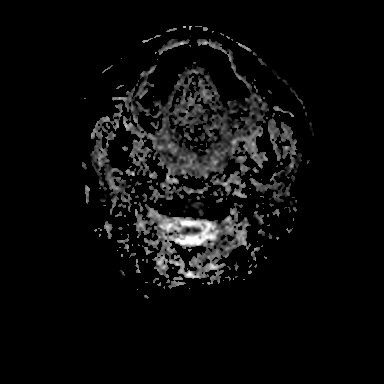
[im 7/48]
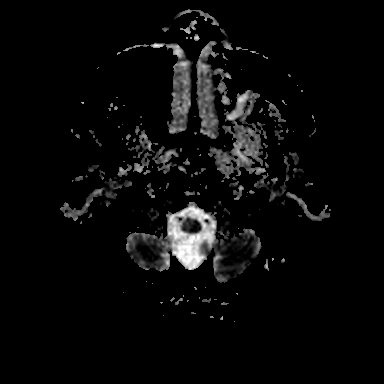
[im 14/48]
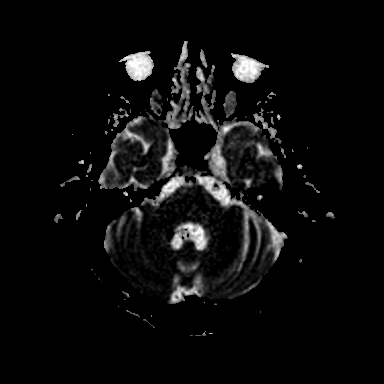
[im 21/48]
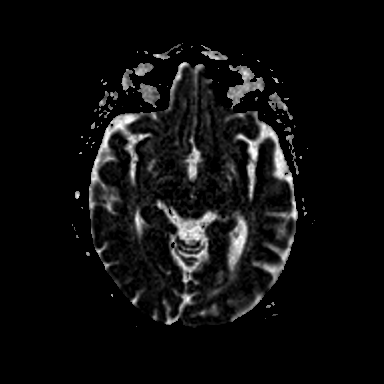
[im 27/48]
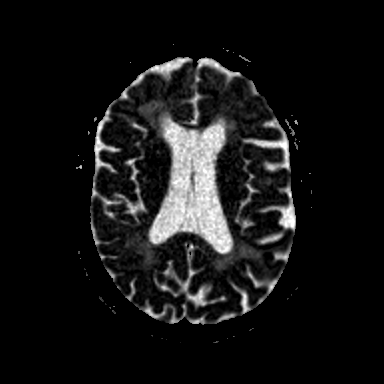
[im 34/48]
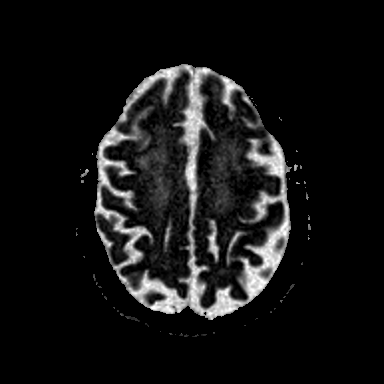
[im 41/48]
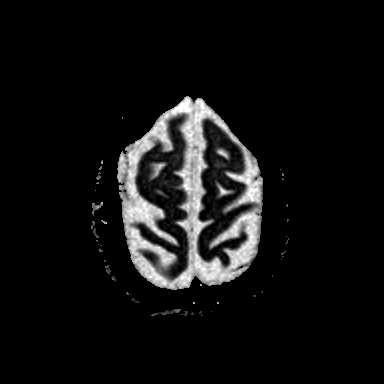
[im 48/48]
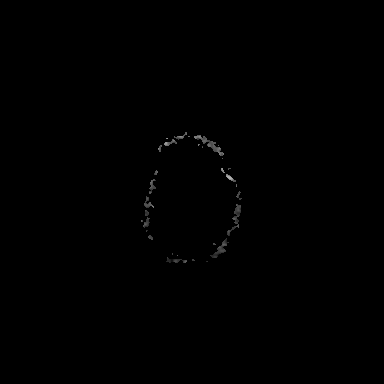

[Series 7: cor dwi_tracew · coronal · 5.0mm · 0.60mm/px · 6 of 38 slices shown]
[im 1/38]
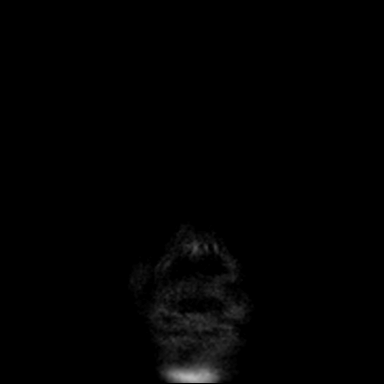
[im 7/38]
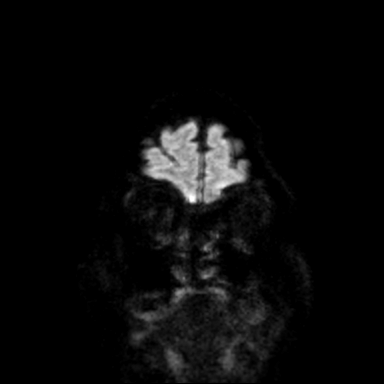
[im 13/38]
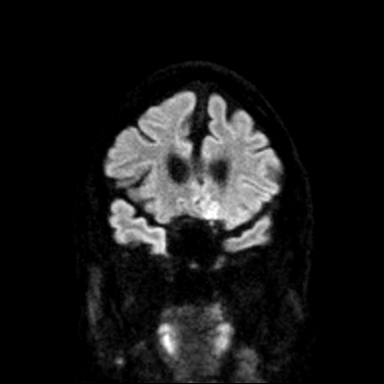
[im 19/38]
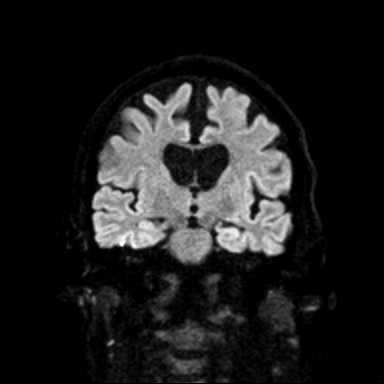
[im 25/38]
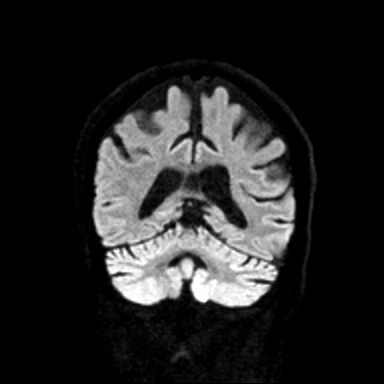
[im 31/38]
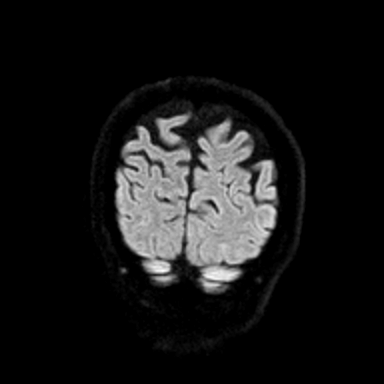

[Series 9: T1 · sagittal · 5.0mm · 0.94mm/px · 4 of 25 slices shown (1 of 2)]
[im 1/25]
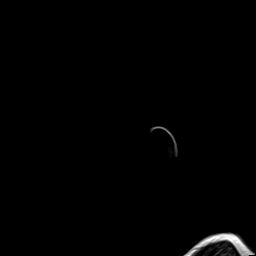
[im 9/25]
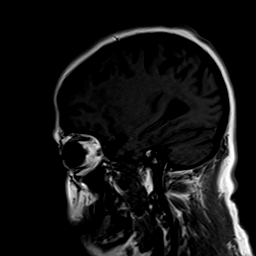
[im 17/25]
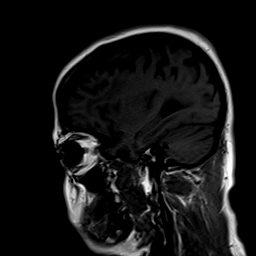
[im 25/25]
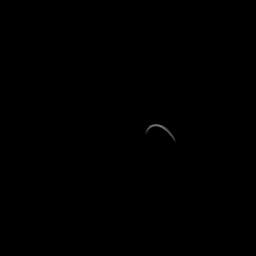

[Series 10: T2 · axial · 5.0mm · 0.45mm/px · z∈[-116,+34]mm · 5 of 27 slices shown]
[im 1/27]
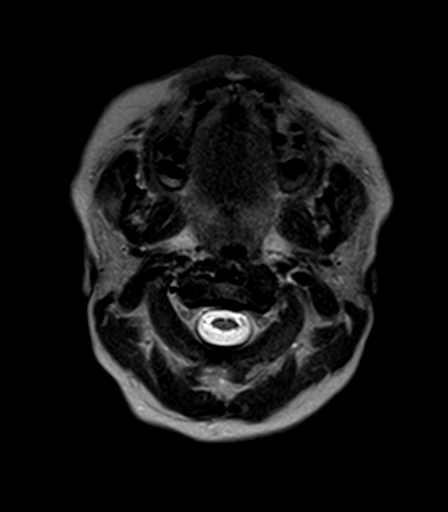
[im 7/27]
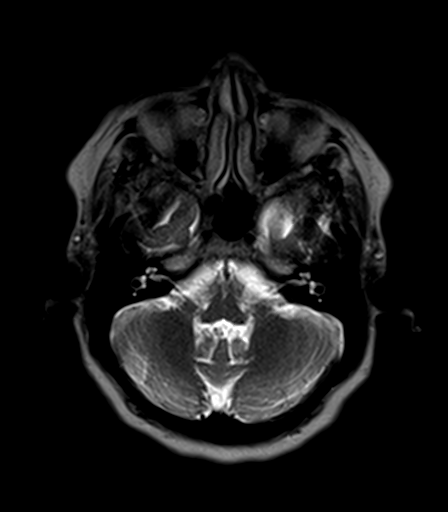
[im 14/27]
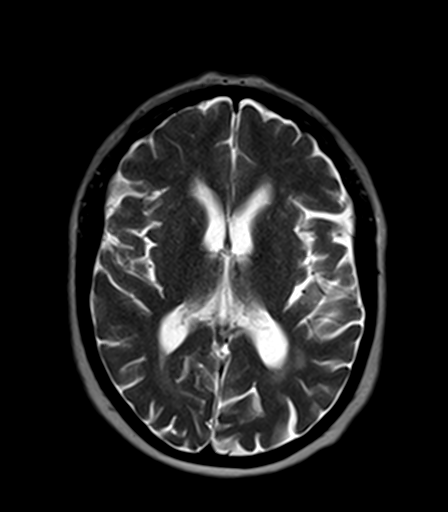
[im 20/27]
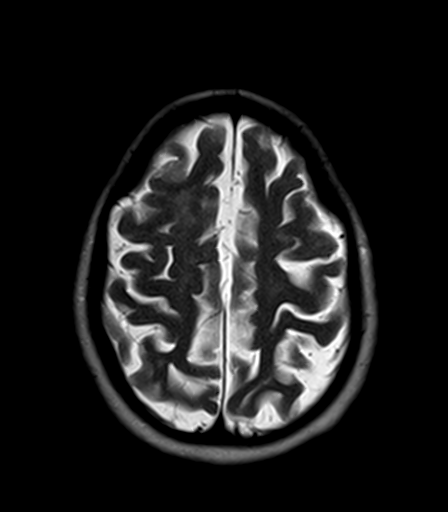
[im 27/27]
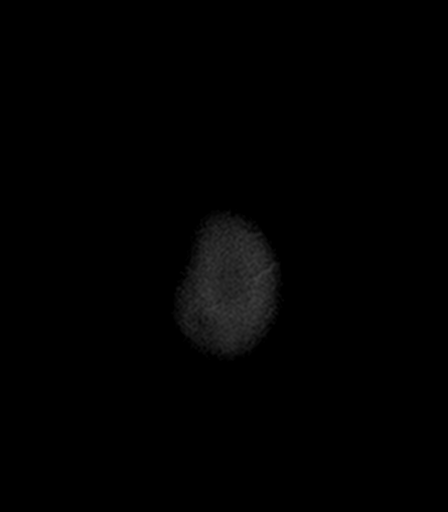

[Series 12: T1 · axial · 5.0mm · 0.90mm/px · z∈[-116,+34]mm · 5 of 27 slices shown (2 of 2)]
[im 1/27]
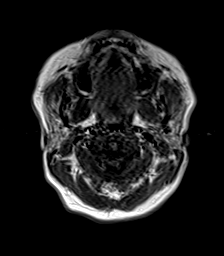
[im 7/27]
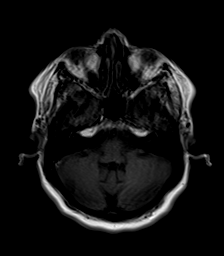
[im 14/27]
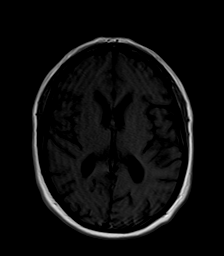
[im 20/27]
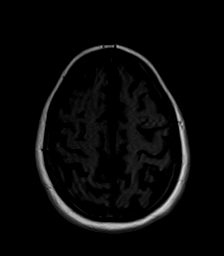
[im 27/27]
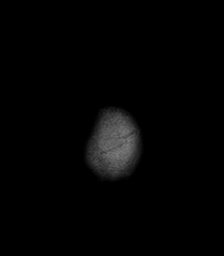

[36 of 48 positions shown; findings below may reference images not displayed]

FINDINGS: Limited study due to patient inability to lie still for the duration
of the study.

Brain: No acute infarction, hemorrhage, hydrocephalus, extra-axial
collection or mass lesion. Scattered and confluent foci of T2
hyperintensity are seen within the white matter of the cerebral
hemispheres, nonspecific, most likely related to chronic small
vessel ischemia. Prominence of the ventricular system, cerebral and
cerebellar sulci as well as thinning of the corpus callosum are
noted, consistent with parenchymal volume loss, more pronounced than
expected for age.

Vascular: Normal flow voids.

Skull and upper cervical spine: There is diffuse decrease of the T1
marrow signal. Although this may represent normal variant, marrow
replacement processes cannot be excluded.

Sinuses/Orbits: Mucosal thickening of the left maxillary sinus is
noted. The orbits are grossly unremarkable.

Other: None.
IMPRESSION: 1. Limited study due to patient inability to lie still for the
duration of the study.
2. No acute intracranial abnormality.
3. Moderate parenchymal volume loss, more pronounced than expected
for age.
4. Moderate chronic small vessel ischemic disease.
5. Diffuse decrease of the T1 marrow signal. Although this may
represent normal variant, marrow replacement processes cannot be
excluded. Correlation with CBC suggested.

## 2021-09-25 ENCOUNTER — Other Ambulatory Visit: Payer: Self-pay | Admitting: Family Medicine

## 2021-09-25 DIAGNOSIS — R748 Abnormal levels of other serum enzymes: Secondary | ICD-10-CM

## 2021-10-02 ENCOUNTER — Ambulatory Visit: Payer: Medicaid Other

## 2021-10-14 ENCOUNTER — Inpatient Hospital Stay (HOSPITAL_COMMUNITY)
Admit: 2021-10-14 | Discharge: 2021-10-14 | Disposition: A | Discharge status: Home / Self Care | Payer: Medicaid Other | Attending: Critical Care Medicine | Admitting: Critical Care Medicine

## 2021-10-14 ENCOUNTER — Emergency Department: Payer: Medicaid Other

## 2021-10-14 ENCOUNTER — Inpatient Hospital Stay: Payer: Medicaid Other

## 2021-10-14 ENCOUNTER — Inpatient Hospital Stay
Admission: EM | Admit: 2021-10-14 | Discharge: 2021-10-18 | DRG: 683 | Disposition: A | Discharge status: Skilled Nursing Facility | Payer: Medicaid Other | Attending: Internal Medicine | Admitting: Internal Medicine

## 2021-10-14 ENCOUNTER — Other Ambulatory Visit: Payer: Self-pay

## 2021-10-14 ENCOUNTER — Encounter: Payer: Self-pay | Admitting: Internal Medicine

## 2021-10-14 DIAGNOSIS — R9431 Abnormal electrocardiogram [ECG] [EKG]: Secondary | ICD-10-CM | POA: Diagnosis not present

## 2021-10-14 DIAGNOSIS — N281 Cyst of kidney, acquired: Secondary | ICD-10-CM | POA: Diagnosis present

## 2021-10-14 DIAGNOSIS — I129 Hypertensive chronic kidney disease with stage 1 through stage 4 chronic kidney disease, or unspecified chronic kidney disease: Secondary | ICD-10-CM | POA: Diagnosis present

## 2021-10-14 DIAGNOSIS — M6282 Rhabdomyolysis: Secondary | ICD-10-CM | POA: Diagnosis present

## 2021-10-14 DIAGNOSIS — Z803 Family history of malignant neoplasm of breast: Secondary | ICD-10-CM

## 2021-10-14 DIAGNOSIS — I959 Hypotension, unspecified: Secondary | ICD-10-CM | POA: Diagnosis present

## 2021-10-14 DIAGNOSIS — L89153 Pressure ulcer of sacral region, stage 3: Secondary | ICD-10-CM | POA: Diagnosis not present

## 2021-10-14 DIAGNOSIS — R7401 Elevation of levels of liver transaminase levels: Secondary | ICD-10-CM

## 2021-10-14 DIAGNOSIS — A419 Sepsis, unspecified organism: Secondary | ICD-10-CM

## 2021-10-14 DIAGNOSIS — T796XXS Traumatic ischemia of muscle, sequela: Secondary | ICD-10-CM | POA: Diagnosis not present

## 2021-10-14 DIAGNOSIS — M21372 Foot drop, left foot: Secondary | ICD-10-CM | POA: Diagnosis present

## 2021-10-14 DIAGNOSIS — D638 Anemia in other chronic diseases classified elsewhere: Secondary | ICD-10-CM

## 2021-10-14 DIAGNOSIS — L89150 Pressure ulcer of sacral region, unstageable: Secondary | ICD-10-CM | POA: Diagnosis present

## 2021-10-14 DIAGNOSIS — Z20822 Contact with and (suspected) exposure to covid-19: Secondary | ICD-10-CM | POA: Diagnosis present

## 2021-10-14 DIAGNOSIS — R6521 Severe sepsis with septic shock: Secondary | ICD-10-CM

## 2021-10-14 DIAGNOSIS — G608 Other hereditary and idiopathic neuropathies: Secondary | ICD-10-CM

## 2021-10-14 DIAGNOSIS — Y92009 Unspecified place in unspecified non-institutional (private) residence as the place of occurrence of the external cause: Secondary | ICD-10-CM | POA: Diagnosis not present

## 2021-10-14 DIAGNOSIS — G629 Polyneuropathy, unspecified: Secondary | ICD-10-CM | POA: Diagnosis present

## 2021-10-14 DIAGNOSIS — T796XXA Traumatic ischemia of muscle, initial encounter: Secondary | ICD-10-CM | POA: Diagnosis present

## 2021-10-14 DIAGNOSIS — Z87891 Personal history of nicotine dependence: Secondary | ICD-10-CM | POA: Diagnosis not present

## 2021-10-14 DIAGNOSIS — L89159 Pressure ulcer of sacral region, unspecified stage: Secondary | ICD-10-CM | POA: Diagnosis not present

## 2021-10-14 DIAGNOSIS — N179 Acute kidney failure, unspecified: Secondary | ICD-10-CM | POA: Diagnosis present

## 2021-10-14 DIAGNOSIS — R296 Repeated falls: Secondary | ICD-10-CM | POA: Diagnosis present

## 2021-10-14 DIAGNOSIS — M21371 Foot drop, right foot: Secondary | ICD-10-CM | POA: Diagnosis present

## 2021-10-14 DIAGNOSIS — W1830XA Fall on same level, unspecified, initial encounter: Secondary | ICD-10-CM | POA: Diagnosis present

## 2021-10-14 DIAGNOSIS — N1831 Chronic kidney disease, stage 3a: Secondary | ICD-10-CM | POA: Diagnosis present

## 2021-10-14 DIAGNOSIS — E872 Acidosis, unspecified: Secondary | ICD-10-CM | POA: Diagnosis present

## 2021-10-14 DIAGNOSIS — Z79899 Other long term (current) drug therapy: Secondary | ICD-10-CM

## 2021-10-14 DIAGNOSIS — Z6841 Body Mass Index (BMI) 40.0 and over, adult: Secondary | ICD-10-CM

## 2021-10-14 DIAGNOSIS — L8990 Pressure ulcer of unspecified site, unspecified stage: Secondary | ICD-10-CM | POA: Insufficient documentation

## 2021-10-14 DIAGNOSIS — E875 Hyperkalemia: Secondary | ICD-10-CM | POA: Diagnosis present

## 2021-10-14 DIAGNOSIS — E861 Hypovolemia: Secondary | ICD-10-CM | POA: Diagnosis present

## 2021-10-14 DIAGNOSIS — D631 Anemia in chronic kidney disease: Secondary | ICD-10-CM | POA: Diagnosis present

## 2021-10-14 DIAGNOSIS — N189 Chronic kidney disease, unspecified: Secondary | ICD-10-CM

## 2021-10-14 LAB — TROPONIN I (HIGH SENSITIVITY)
Troponin I (High Sensitivity): 12 ng/L (ref <18)
Troponin I (High Sensitivity): 7 ng/L (ref <18)

## 2021-10-14 LAB — GLUCOSE, CAPILLARY
Glucose-Capillary: 68 mg/dL — ABNORMAL LOW (ref 70–99)
Glucose-Capillary: 70 mg/dL (ref 70–99)
Glucose-Capillary: 131 mg/dL — ABNORMAL HIGH (ref 70–99)
Glucose-Capillary: 93 mg/dL (ref 70–99)
Glucose-Capillary: 85 mg/dL (ref 70–99)
Glucose-Capillary: 91 mg/dL (ref 70–99)

## 2021-10-14 LAB — URINALYSIS, COMPLETE (UACMP) WITH MICROSCOPIC
Bilirubin Urine: NEGATIVE
Glucose, UA: NEGATIVE mg/dL
Ketones, ur: NEGATIVE mg/dL
Leukocytes,Ua: NEGATIVE
Nitrite: NEGATIVE
Protein, ur: 30 mg/dL — AB
Specific Gravity, Urine: 1.023 (ref 1.005–1.030)
pH: 5 (ref 5.0–8.0)

## 2021-10-14 LAB — URINE DRUG SCREEN, QUALITATIVE (ARMC ONLY)
Amphetamines, Ur Screen: NOT DETECTED
Barbiturates, Ur Screen: NOT DETECTED
Benzodiazepine, Ur Scrn: NOT DETECTED
Cannabinoid 50 Ng, Ur ~~LOC~~: NOT DETECTED
Cocaine Metabolite,Ur ~~LOC~~: NOT DETECTED
MDMA (Ecstasy)Ur Screen: NOT DETECTED
Methadone Scn, Ur: NOT DETECTED
Opiate, Ur Screen: NOT DETECTED
Phencyclidine (PCP) Ur S: NOT DETECTED
Tricyclic, Ur Screen: NOT DETECTED

## 2021-10-14 LAB — CBC WITH DIFFERENTIAL/PLATELET
Abs Immature Granulocytes: 0.1 10*3/uL — ABNORMAL HIGH (ref 0.00–0.07)
Basophils Absolute: 0 10*3/uL (ref 0.0–0.1)
Basophils Relative: 0 %
Eosinophils Absolute: 0.1 10*3/uL (ref 0.0–0.5)
Eosinophils Relative: 1 %
HCT: 33.1 % — ABNORMAL LOW (ref 36.0–46.0)
Hemoglobin: 10.6 g/dL — ABNORMAL LOW (ref 12.0–15.0)
Immature Granulocytes: 1 %
Lymphocytes Relative: 6 %
Lymphs Abs: 1 10*3/uL (ref 0.7–4.0)
MCH: 26.9 pg (ref 26.0–34.0)
MCHC: 32 g/dL (ref 30.0–36.0)
MCV: 84 fL (ref 80.0–100.0)
Monocytes Absolute: 1.3 10*3/uL — ABNORMAL HIGH (ref 0.1–1.0)
Monocytes Relative: 7 %
Neutro Abs: 16.5 10*3/uL — ABNORMAL HIGH (ref 1.7–7.7)
Neutrophils Relative %: 85 %
Platelets: 303 10*3/uL (ref 150–400)
RBC: 3.94 MIL/uL (ref 3.87–5.11)
RDW: 14.4 % (ref 11.5–15.5)
WBC: 19.1 10*3/uL — ABNORMAL HIGH (ref 4.0–10.5)
nRBC: 0 % (ref 0.0–0.2)

## 2021-10-14 LAB — COMPREHENSIVE METABOLIC PANEL
ALT: 46 U/L — ABNORMAL HIGH (ref 0–44)
AST: 84 U/L — ABNORMAL HIGH (ref 15–41)
Albumin: 2.9 g/dL — ABNORMAL LOW (ref 3.5–5.0)
Alkaline Phosphatase: 91 U/L (ref 38–126)
Anion gap: 16 — ABNORMAL HIGH (ref 5–15)
BUN: 88 mg/dL — ABNORMAL HIGH (ref 6–20)
CO2: 13 mmol/L — ABNORMAL LOW (ref 22–32)
Calcium: 8.8 mg/dL — ABNORMAL LOW (ref 8.9–10.3)
Chloride: 112 mmol/L — ABNORMAL HIGH (ref 98–111)
Creatinine, Ser: 7.54 mg/dL — ABNORMAL HIGH (ref 0.44–1.00)
GFR, Estimated: 6 mL/min — ABNORMAL LOW (ref >60)
Glucose, Bld: 83 mg/dL (ref 70–99)
Potassium: 5.6 mmol/L — ABNORMAL HIGH (ref 3.5–5.1)
Sodium: 141 mmol/L (ref 135–145)
Total Bilirubin: 0.9 mg/dL (ref 0.3–1.2)
Total Protein: 6.5 g/dL (ref 6.5–8.1)

## 2021-10-14 LAB — BLOOD GAS, VENOUS
Acid-base deficit: 10 mmol/L — ABNORMAL HIGH (ref 0.0–2.0)
Bicarbonate: 16.2 mmol/L — ABNORMAL LOW (ref 20.0–28.0)
O2 Saturation: 60.2 %
Patient temperature: 37
pCO2, Ven: 36 mmHg — ABNORMAL LOW (ref 44–60)
pH, Ven: 7.26 (ref 7.25–7.43)
pO2, Ven: 36 mmHg (ref 32–45)

## 2021-10-14 LAB — BASIC METABOLIC PANEL
Anion gap: 14 (ref 5–15)
Anion gap: 13 (ref 5–15)
BUN: 96 mg/dL — ABNORMAL HIGH (ref 6–20)
BUN: 91 mg/dL — ABNORMAL HIGH (ref 6–20)
CO2: 15 mmol/L — ABNORMAL LOW (ref 22–32)
CO2: 19 mmol/L — ABNORMAL LOW (ref 22–32)
Calcium: 8.9 mg/dL (ref 8.9–10.3)
Calcium: 8.5 mg/dL — ABNORMAL LOW (ref 8.9–10.3)
Chloride: 112 mmol/L — ABNORMAL HIGH (ref 98–111)
Chloride: 111 mmol/L (ref 98–111)
Creatinine, Ser: 6.26 mg/dL — ABNORMAL HIGH (ref 0.44–1.00)
Creatinine, Ser: 5.13 mg/dL — ABNORMAL HIGH (ref 0.44–1.00)
GFR, Estimated: 7 mL/min — ABNORMAL LOW (ref >60)
GFR, Estimated: 9 mL/min — ABNORMAL LOW (ref >60)
Glucose, Bld: 79 mg/dL (ref 70–99)
Glucose, Bld: 101 mg/dL — ABNORMAL HIGH (ref 70–99)
Potassium: 5.2 mmol/L — ABNORMAL HIGH (ref 3.5–5.1)
Potassium: 4.3 mmol/L (ref 3.5–5.1)
Sodium: 141 mmol/L (ref 135–145)
Sodium: 143 mmol/L (ref 135–145)

## 2021-10-14 LAB — PROCALCITONIN: Procalcitonin: 1.39 ng/mL

## 2021-10-14 LAB — ECHOCARDIOGRAM COMPLETE
AR max vel: 2.3 cm2
AV Area VTI: 2.82 cm2
AV Area mean vel: 2.66 cm2
AV Mean grad: 5.5 mmHg
AV Peak grad: 11.5 mmHg
Ao pk vel: 1.7 m/s
Area-P 1/2: 5.58 cm2
Height: 63 in
S' Lateral: 2.9 cm
Weight: 3777.8 oz

## 2021-10-14 LAB — BLOOD GAS, ARTERIAL
Acid-base deficit: 2.6 mmol/L — ABNORMAL HIGH (ref 0.0–2.0)
Bicarbonate: 19.7 mmol/L — ABNORMAL LOW (ref 20.0–28.0)
FIO2: 21 %
O2 Saturation: 99.3 %
Patient temperature: 37
pCO2 arterial: 27 mmHg — ABNORMAL LOW (ref 32–48)
pH, Arterial: 7.47 — ABNORMAL HIGH (ref 7.35–7.45)
pO2, Arterial: 92 mmHg (ref 83–108)

## 2021-10-14 LAB — PROTIME-INR
INR: 1.4 — ABNORMAL HIGH (ref 0.8–1.2)
Prothrombin Time: 16.9 seconds — ABNORMAL HIGH (ref 11.4–15.2)

## 2021-10-14 LAB — LIPASE, BLOOD: Lipase: 38 U/L (ref 11–51)

## 2021-10-14 LAB — LACTIC ACID, PLASMA
Lactic Acid, Venous: 2.3 mmol/L (ref 0.5–1.9)
Lactic Acid, Venous: 1.2 mmol/L (ref 0.5–1.9)

## 2021-10-14 LAB — APTT: aPTT: 31 seconds (ref 24–36)

## 2021-10-14 LAB — IRON AND TIBC
Iron: 47 ug/dL (ref 28–170)
Saturation Ratios: 32 % — ABNORMAL HIGH (ref 10.4–31.8)
TIBC: 147 ug/dL — ABNORMAL LOW (ref 250–450)
UIBC: 100 ug/dL

## 2021-10-14 LAB — BRAIN NATRIURETIC PEPTIDE: B Natriuretic Peptide: 8.6 pg/mL (ref 0.0–100.0)

## 2021-10-14 LAB — CK: Total CK: 3746 U/L — ABNORMAL HIGH (ref 38–234)

## 2021-10-14 LAB — MRSA NEXT GEN BY PCR, NASAL: MRSA by PCR Next Gen: NOT DETECTED

## 2021-10-14 LAB — FOLATE: Folate: 7.6 ng/mL (ref >5.9)

## 2021-10-14 LAB — VITAMIN B12: Vitamin B-12: 1636 pg/mL — ABNORMAL HIGH (ref 180–914)

## 2021-10-14 LAB — ETHANOL: Alcohol, Ethyl (B): <10 mg/dL (ref <10)

## 2021-10-14 LAB — FERRITIN: Ferritin: 354 ng/mL — ABNORMAL HIGH (ref 11–307)

## 2021-10-14 LAB — SARS CORONAVIRUS 2 BY RT PCR: SARS Coronavirus 2 by RT PCR: NEGATIVE

## 2021-10-14 MED ORDER — DOCUSATE SODIUM 100 MG PO CAPS: 100.0000 mg | ORAL_CAPSULE | Freq: Two times a day (BID) | ORAL | Status: DC | PRN

## 2021-10-14 MED ORDER — ACETAMINOPHEN 325 MG PO TABS
650.0000 mg | ORAL_TABLET | ORAL | Status: DC | PRN
  Administered 2021-10-14: 650 mg via ORAL
  Filled 2021-10-14: qty 2

## 2021-10-14 MED ORDER — LACTATED RINGERS IV BOLUS (SEPSIS)
1000.0000 mL | Freq: Once | INTRAVENOUS | Status: AC
Start: 2021-10-14 — End: 2021-10-14
  Administered 2021-10-14: 1000 mL via INTRAVENOUS

## 2021-10-14 MED ORDER — SODIUM BICARBONATE 8.4 % IV SOLN
INTRAVENOUS | Status: DC
  Filled 2021-10-14: qty 1000
  Filled 2021-10-14: qty 150
  Filled 2021-10-14: qty 1000

## 2021-10-14 MED ORDER — MIDODRINE HCL 5 MG PO TABS
10.0000 mg | ORAL_TABLET | Freq: Three times a day (TID) | ORAL | Status: DC
  Administered 2021-10-14 – 2021-10-17 (×8): 10 mg via ORAL
  Filled 2021-10-14 (×8): qty 2

## 2021-10-14 MED ORDER — SODIUM CHLORIDE 0.9 % IV SOLN: 250.0000 mL | INTRAVENOUS | Status: DC

## 2021-10-14 MED ORDER — NOREPINEPHRINE 4 MG/250ML-% IV SOLN
2.0000 ug/min | INTRAVENOUS | Status: DC
  Filled 2021-10-14: qty 250

## 2021-10-14 MED ORDER — LACTATED RINGERS IV BOLUS (SEPSIS)
1000.0000 mL | Freq: Once | INTRAVENOUS | Status: AC
  Administered 2021-10-14: 1000 mL via INTRAVENOUS

## 2021-10-14 MED ORDER — VANCOMYCIN HCL IN DEXTROSE 1-5 GM/200ML-% IV SOLN: 1000.0000 mg | Freq: Once | INTRAVENOUS | Status: DC

## 2021-10-14 MED ORDER — ZINC OXIDE 20 % EX OINT
TOPICAL_OINTMENT | Freq: Three times a day (TID) | CUTANEOUS | Status: DC
Start: 2021-10-14 — End: 2021-10-18
  Filled 2021-10-14: qty 28.35

## 2021-10-14 MED ORDER — ADULT MULTIVITAMIN W/MINERALS CH
1.0000 | ORAL_TABLET | Freq: Every day | ORAL | Status: DC
  Administered 2021-10-15 – 2021-10-18 (×4): 1 via ORAL
  Filled 2021-10-14 (×4): qty 1

## 2021-10-14 MED ORDER — ZINC OXIDE 20 % EX OINT
TOPICAL_OINTMENT | Freq: Three times a day (TID) | CUTANEOUS | Status: DC
  Filled 2021-10-14: qty 28.35
  Filled 2021-10-14: qty 30

## 2021-10-14 MED ORDER — ENSURE ENLIVE PO LIQD
237.0000 mL | Freq: Three times a day (TID) | ORAL | Status: DC
  Administered 2021-10-14 – 2021-10-16 (×6): 237 mL via ORAL

## 2021-10-14 MED ORDER — VITAMIN C 500 MG PO TABS
500.0000 mg | ORAL_TABLET | Freq: Two times a day (BID) | ORAL | Status: DC
  Administered 2021-10-15 – 2021-10-18 (×7): 500 mg via ORAL
  Filled 2021-10-14 (×7): qty 1

## 2021-10-14 MED ORDER — ASPIRIN 81 MG PO TBEC
81.0000 mg | DELAYED_RELEASE_TABLET | Freq: Every day | ORAL | Status: DC
  Administered 2021-10-14 – 2021-10-18 (×5): 81 mg via ORAL
  Filled 2021-10-14 (×5): qty 1

## 2021-10-14 MED ORDER — SODIUM CHLORIDE 0.9 % IV BOLUS
500.0000 mL | Freq: Once | INTRAVENOUS | Status: AC
  Administered 2021-10-14: 500 mL via INTRAVENOUS

## 2021-10-14 MED ORDER — VANCOMYCIN HCL 2000 MG/400ML IV SOLN
2000.0000 mg | Freq: Once | INTRAVENOUS | Status: AC
  Administered 2021-10-14: 2000 mg via INTRAVENOUS
  Filled 2021-10-14: qty 400

## 2021-10-14 MED ORDER — CALCIUM GLUCONATE-NACL 1-0.675 GM/50ML-% IV SOLN
1.0000 g | Freq: Once | INTRAVENOUS | Status: AC
  Administered 2021-10-14: 1000 mg via INTRAVENOUS
  Filled 2021-10-14: qty 50

## 2021-10-14 MED ORDER — SODIUM BICARBONATE 8.4 % IV SOLN
100.0000 meq | Freq: Once | INTRAVENOUS | Status: AC
  Administered 2021-10-14: 100 meq via INTRAVENOUS
  Filled 2021-10-14: qty 50

## 2021-10-14 MED ORDER — SODIUM CHLORIDE 0.9 % IV SOLN
2.0000 g | Freq: Once | INTRAVENOUS | Status: AC
  Administered 2021-10-14: 2 g via INTRAVENOUS
  Filled 2021-10-14: qty 12.5

## 2021-10-14 MED ORDER — CHLORHEXIDINE GLUCONATE CLOTH 2 % EX PADS
6.0000 | MEDICATED_PAD | Freq: Every day | CUTANEOUS | Status: DC
  Administered 2021-10-14 – 2021-10-18 (×5): 6 via TOPICAL

## 2021-10-14 MED ORDER — MEDIHONEY WOUND/BURN DRESSING EX PSTE
1.0000 | PASTE | Freq: Every day | CUTANEOUS | Status: DC
  Administered 2021-10-14 – 2021-10-18 (×4): 1 via TOPICAL
  Filled 2021-10-14 (×2): qty 44

## 2021-10-14 MED ORDER — ORAL CARE MOUTH RINSE: 15.0000 mL | OROMUCOSAL | Status: DC | PRN

## 2021-10-14 MED ORDER — HEPARIN SODIUM (PORCINE) 5000 UNIT/ML IJ SOLN
5000.0000 [IU] | Freq: Three times a day (TID) | INTRAMUSCULAR | Status: DC
  Administered 2021-10-14 – 2021-10-18 (×12): 5000 [IU] via SUBCUTANEOUS
  Filled 2021-10-14 (×12): qty 1

## 2021-10-14 MED ORDER — POLYETHYLENE GLYCOL 3350 17 G PO PACK: 17.0000 g | PACK | Freq: Every day | ORAL | Status: DC | PRN

## 2021-10-14 MED ORDER — PANTOPRAZOLE SODIUM 40 MG PO TBEC
40.0000 mg | DELAYED_RELEASE_TABLET | Freq: Every day | ORAL | Status: DC
  Administered 2021-10-14 – 2021-10-18 (×5): 40 mg via ORAL
  Filled 2021-10-14 (×5): qty 1

## 2021-10-14 NOTE — ED Notes (Signed)
Patient placed on hospital bed for comfort. Patient with very old dried BM to buttocks and legs. Pericare performed and linens changed. Patient with multiple open wounds, excoriation to buttocks and yellow slough to sacrum. I/O cath performed for urine collection. Placed in low position with call light in reach and two siderails up.

## 2021-10-14 NOTE — TOC Initial Note (Signed)
Transition of Care [facility]) - Initial/Assessment Note    Patient Details  Name: Karen Ray MRN: [id number] Date of Birth: 02/23/1965  Transition of Care [facility]) CM/SW Contact:    Shelbie Hutching, RN Phone Number: 10/14/2021, 3:51 PM  Clinical Narrative:                 Patient admitted for rhabdomyolysis, patient fell at home and could not get up, she is not able to support her own weight and is pretty much confined to a hoverround or recliner, she needs help to get up and lives alone, she also has not been eating or drinking much.  She is supposed to have an aide coming out but apparently they only come out once a month to go grocery shopping.   RNCM met with patient at the bedside, daughter in law is present.  Daughter lives in [place], she would like for the patient to come and live with her so she can take care of her.  Plan will be for some rehab here at discharge and then daughter will carry patient to [place] with her.  Patient has Medicaid.  Patient would like for her son to be HCPOA.  RNCM placed a spiritual consult for Advanced Directives.   PT eval has been ordered.   RNCM will begin bed search.   Expected Discharge Plan: Skilled Nursing Facility Barriers to Discharge: Continued Medical Work up   Patient Goals and CMS Choice Patient states their goals for this hospitalization and ongoing recovery are:: Patient wants to be able to walk again CMS Medicare.gov Compare Post Acute Care list provided to:: Patient Choice offered to / list presented to : Patient, Adult Children  Expected Discharge Plan and Services Expected Discharge Plan: [place]   Discharge Planning Services: CM Consult Post Acute Care Choice: [place] Living arrangements for the past 2 months: Apartment                 DME Arranged: N/A DME Agency: NA       HH Arranged: NA [place] Agency: NA        Prior Living Arrangements/Services Living arrangements for the past 2  months: Apartment Lives with:: Self Patient language and need for interpreter reviewed:: Yes Do you feel safe going back to the place where you live?: Yes      Need for Family Participation in Patient Care: Yes (Comment) Care giver support system in place?: Yes (comment) Current home services: DME (hoverround, wheelchair) Criminal Activity/Legal Involvement Pertinent to Current Situation/Hospitalization: No - Comment as needed  Activities of Daily Living Home Assistive Devices/Equipment: Wheelchair, Environmental consultant (specify type) ADL Screening (condition at time of admission) Patient's cognitive ability adequate to safely complete daily activities?: No Is the patient deaf or have difficulty hearing?: Yes Does the patient have difficulty seeing, even when wearing glasses/contacts?: No Does the patient have difficulty concentrating, remembering, or making decisions?: Yes Patient able to express need for assistance with ADLs?: Yes Does the patient have difficulty dressing or bathing?: Yes Independently performs ADLs?: No Communication: Needs assistance Is this a change from baseline?: Pre-admission baseline Dressing (OT): Needs assistance Is this a change from baseline?: Pre-admission baseline Grooming: Needs assistance Is this a change from baseline?: Pre-admission baseline Feeding: Needs assistance Is this a change from baseline?: Pre-admission baseline Bathing: Needs assistance Is this a change from baseline?: Pre-admission baseline Toileting: Needs assistance Is this a change from baseline?: Pre-admission baseline In/Out Bed: Needs assistance Is this a change  from baseline?: Pre-admission baseline Walks in Home: Needs assistance Is this a change from baseline?: Pre-admission baseline Does the patient have difficulty walking or climbing stairs?: Yes Weakness of Legs: Both Weakness of Arms/Hands: Both  Permission Sought/Granted Permission sought to share information with : Case Manager,  Family Supports, Customer service manager Permission granted to share information with : Yes, Verbal Permission Granted  Share Information with NAME: Gladstone Lighter  Permission granted to share info w AGENCY: SNF's  Permission granted to share info w Relationship: Daughter  Permission granted to share info w Contact Information: [phone]  Emotional Assessment Appearance:: Appears older than stated age Attitude/Demeanor/Rapport: Engaged Affect (typically observed): Accepting Orientation: : Oriented to Self, Oriented to Place, Oriented to  Time, Oriented to Situation Alcohol / Substance Use: Not Applicable Psych Involvement: No (comment)  Admission diagnosis:  Rhabdomyolysis [M62.82] Septic shock ([place]) [A41.9, R65.21] Acute renal failure, unspecified acute renal failure type ([place]) [N17.9] Pressure injury of skin of sacral region, unspecified injury stage [L89.159] Patient Active Problem List   Diagnosis Date Noted   Pressure injury of skin 10/14/2021   Rhabdomyolysis 10/14/2021   PCP:  Marguerita Merles, MD Pharmacy:   [place], [place] [address] Phone: [phone] Fax: [phone]     Social Determinants of Health (SDOH) Interventions    Readmission Risk Interventions     No data to display

## 2021-10-14 NOTE — Progress Notes (Signed)
   10/14/21 1600  Clinical Encounter Type  Visited With Patient  Visit Type Initial  Referral From Nurse  Consult/Referral To Chaplain   Chaplain responded to [facility] consult to provide information packet for HCPOA. Information explained and document left with patient.

## 2021-10-14 NOTE — Sepsis Progress Note (Signed)
Following for sepsis monitoring ?

## 2021-10-14 NOTE — Progress Notes (Signed)
An USGPIV (ultrasound guided PIV) 20G x 2.5" has been placed for short-term vasopressor infusion. A correctly placed ivWatch must be used when administering Vasopressors. Should this treatment be needed beyond 72 hours, central line access should be obtained.  It will be the responsibility of the bedside nurse to follow best practice to prevent extravasations.

## 2021-10-14 NOTE — ED Notes (Signed)
Lab called requesting phlebotomist to draw any blood that was hemolyzed.

## 2021-10-14 NOTE — Progress Notes (Signed)
CODE SEPSIS - PHARMACY COMMUNICATION  **Broad Spectrum Antibiotics should be administered within 1 hour of Sepsis diagnosis**  Time Code Sepsis Called/Page Received: 4469  Antibiotics Ordered: Cefepime & Vancomycin  Time of 1st antibiotic administration: 0407  Otelia Sergeant, PharmD, [facility] 10/14/2021 3:44 AM

## 2021-10-14 NOTE — Progress Notes (Signed)
*  PRELIMINARY RESULTS* Echocardiogram 2D Echocardiogram has been performed.  Cristela Blue 10/14/2021, 11:17 AM

## 2021-10-14 NOTE — Progress Notes (Signed)
Patients BP 93/47 notified Annabelle Harman NP. She will place orders for fluid bolus. Continue to assess.

## 2021-10-14 NOTE — Consult Note (Signed)
PHARMACY CONSULT NOTE  Pharmacy Consult for Electrolyte Monitoring and Replacement   Recent Labs: Potassium (mmol/L)  Date Value  10/14/2021 5.2 (H)   Calcium (mg/dL)  Date Value  12/04/5943 8.9   Albumin (g/dL)  Date Value  85/92/9244 2.9 (L)   Sodium (mmol/L)  Date Value  10/14/2021 141   Assessment: Patient is a 56 y/o F with medical history including CKD, HTN, neuropathy who is admitted with acute renal failure, rhabdomyolysis, and hypotension. Patient is currently admitted to the ICU. Pharmacy consulted to assist with electrolyte monitoring and replacement as indicated.  MIVF: Isotonic bicarbonate gtt at 100 cc/hr  Goal of Therapy:  Electrolytes within normal limits  Plan:  --Hyperkalemia and metabolic acidosis improving on bicarbonate gtt. Continue with current therapies --Follow-up electrolytes with AM labs tomorrow  Tressie Ellis 10/14/2021 12:22 PM

## 2021-10-14 NOTE — ED Provider Notes (Signed)
[facility] Provider Note    Event Date/Time   First MD Initiated Contact with Patient 10/14/21 0103     (approximate)   History   Fall   HPI Level 5 caveat:  history/ROS limited by acute/critical illness   Karen Ray is a 56 y.o. female whose medical history includes only hypertension and possible relatively minor chronic kidney disease.  She has also had some numbness and tingling and a right leg for which she has seen neurology.  She presents tonight by EMS for evaluation after a fall or possibly 2 falls.  History is limited, but reportedly she fell earlier today and refused to come to the emergency department.  She then fell a second time and was found between the couch and her motorized wheelchair.  She is denying any acute pain and she says she has no shortness of breath.  She has extensive amounts of dried stool on her backside.  She denies any recent difficulties breathing, recent fever, abdominal pain, nausea, vomiting, and dysuria.  She said that she feels okay, just a little bit weak.  She says she has not been eating or drinking very well today but thinks that she has been fine before that.     Physical Exam   Triage Vital Signs: ED Triage Vitals  Enc Vitals Group     BP 10/14/21 0108 (!) 87/32     Pulse Rate 10/14/21 0108 90     Resp 10/14/21 0108 (!) 26     Temp 10/14/21 0108 97.6 F (36.4 C)     Temp Source 10/14/21 0108 Oral     SpO2 10/14/21 0108 98 %     Weight 10/14/21 0110 90.7 kg (200 lb)     Height 10/14/21 0110 1.6 m (5\' 3" )     Head Circumference --      Peak Flow --      Pain Score 10/14/21 0110 0     Pain Loc --      Pain Edu? --      Excl. in GC? --     Most recent vital signs: Vitals:   10/14/21 0513 10/14/21 0513  BP:  96/85  Pulse:  97  Resp:  19  Temp: 97.6 F (36.4 C) 97.6 F (36.4 C)  SpO2:  100%     General: Awake, no distress.  Disheveled. CV:  Good peripheral perfusion.  Mild tachycardia.   Regular rhythm. Resp:  Normal effort.  Lungs are clear to auscultation bilaterally.  No accessory muscle usage or retractions. Abd:  Obese.  No distention.  No tenderness to palpation throughout the abdomen. Other:  Lower extremities appear chronically edematous but without any obvious acute abnormality.  She has some open sacral decubitus ulcers.  She was covered extensively with dried stool that the nurses cleaned up.     ED Results / Procedures / Treatments   Labs (all labs ordered are listed, but only abnormal results are displayed) Labs Reviewed  LACTIC ACID, PLASMA - Abnormal; Notable for the following components:      Result Value   Lactic Acid, Venous 2.3 (*)    All other components within normal limits  COMPREHENSIVE METABOLIC PANEL - Abnormal; Notable for the following components:   Potassium 5.6 (*)    Chloride 112 (*)    CO2 13 (*)    BUN 88 (*)    Creatinine, Ser 7.54 (*)    Calcium 8.8 (*)    Albumin 2.9 (*)  AST 84 (*)    ALT 46 (*)    GFR, Estimated 6 (*)    Anion gap 16 (*)    All other components within normal limits  CBC WITH DIFFERENTIAL/PLATELET - Abnormal; Notable for the following components:   WBC 19.1 (*)    Hemoglobin 10.6 (*)    HCT 33.1 (*)    Neutro Abs 16.5 (*)    Monocytes Absolute 1.3 (*)    Abs Immature Granulocytes 0.10 (*)    All other components within normal limits  PROTIME-INR - Abnormal; Notable for the following components:   Prothrombin Time 16.9 (*)    INR 1.4 (*)    All other components within normal limits  URINALYSIS, COMPLETE (UACMP) WITH MICROSCOPIC - Abnormal; Notable for the following components:   Color, Urine AMBER (*)    APPearance CLOUDY (*)    Hgb urine dipstick MODERATE (*)    Protein, ur 30 (*)    Bacteria, UA RARE (*)    All other components within normal limits  CK - Abnormal; Notable for the following components:   Total CK 3,746 (*)    All other components within normal limits  SARS CORONAVIRUS 2 BY RT PCR   CULTURE, BLOOD (ROUTINE X 2)  CULTURE, BLOOD (ROUTINE X 2)  URINE CULTURE  LACTIC ACID, PLASMA  APTT  BRAIN NATRIURETIC PEPTIDE  LIPASE, BLOOD  PROCALCITONIN  POC URINE PREG, ED  TROPONIN I (HIGH SENSITIVITY)  TROPONIN I (HIGH SENSITIVITY)     EKG  ED ECG REPORT I, Hinda Kehr, the attending physician, personally viewed and interpreted this ECG.  Date: 10/14/2021 EKG Time: 1:11 AM Rate: 99 Rhythm: normal sinus rhythm QRS Axis: normal Intervals: normal ST/T Wave abnormalities: Non-specific ST segment / T-wave changes, but no clear evidence of acute ischemia. Narrative Interpretation: no definitive evidence of acute ischemia; does not meet STEMI criteria.    RADIOLOGY See hospital course for details:  no acute abnormalities identified on CT chest/abd/pelvis    PROCEDURES:  Critical Care performed: Yes, see critical care procedure note(s)  .1-3 Lead EKG Interpretation  Performed by: Hinda Kehr, MD Authorized by: Hinda Kehr, MD     Interpretation: abnormal     ECG rate:  105   ECG rate assessment: tachycardic     Rhythm: sinus tachycardia     Ectopy: none     Conduction: normal   .Critical Care  Performed by: Hinda Kehr, MD Authorized by: Hinda Kehr, MD   Critical care provider statement:    Critical care time (minutes):  60   Critical care time was exclusive of:  Separately billable procedures and treating other patients   Critical care was necessary to treat or prevent imminent or life-threatening deterioration of the following conditions:  Sepsis and renal failure   Critical care was time spent personally by me on the following activities:  Development of treatment plan with patient or surrogate, evaluation of patient's response to treatment, examination of patient, obtaining history from patient or surrogate, ordering and performing treatments and interventions, ordering and review of laboratory studies, ordering and review of radiographic  studies, pulse oximetry, re-evaluation of patient's condition and review of old charts    MEDICATIONS ORDERED IN ED: Medications  vancomycin (VANCOREADY) IVPB 2000 mg/400 mL (2,000 mg Intravenous New Bag/Given 10/14/21 0405)  calcium gluconate 1 g/ 50 mL sodium chloride IVPB (1,000 mg Intravenous New Bag/Given 10/14/21 0511)  lactated ringers bolus 1,000 mL (0 mLs Intravenous Stopped 10/14/21 0317)  lactated ringers bolus  1,000 mL (0 mLs Intravenous Stopped 10/14/21 0443)    And  lactated ringers bolus 1,000 mL (1,000 mLs Intravenous New Bag/Given 10/14/21 0514)  ceFEPIme (MAXIPIME) 2 g in sodium chloride 0.9 % 100 mL IVPB (0 g Intravenous Stopped 10/14/21 0443)     IMPRESSION / MDM / ASSESSMENT AND PLAN / ED COURSE  I reviewed the triage vital signs and the nursing notes.                              Differential diagnosis includes, but is not limited to, sepsis, UTI, pneumonia, intra-abdominal infection, electrolyte or metabolic abnormality, renal failure.  Less likely ACS or CVA.  Patient's presentation is most consistent with acute presentation with potential threat to life or bodily function.  The patient appears dehydrated and volume depleted.  She arrives hypotensive, afebrile but mildly tachycardic.  This could all be due to volume depletion but I am concerned for sepsis and ordered standard "probable sepsis" work-up: I ordered standard sepsis labs including the following: COVID-19 PCR swab, blood cultures x2, pro time-INR, CMP, urinalysis, urine culture, lactic acid, APTT, CBC with differential, high-sensitivity troponin, lipase.  Also ordered procalcitonin and CK given my concern for possible rhabdomyolysis.   I am starting with LR 1 L IV bolus.  The patient is denying any pain and does not require analgesia at this time.  EKG generally unremarkable other than tachypnea.  She has no focal neurological deficits and no obvious musculoskeletal injuries.  I strongly suspect volume  depletion leading to renal failure based on her overall appearance and I will hold off on any advanced imaging until seeing what her renal function is, but I ordered a chest x-ray as well.  The patient is on the cardiac monitor to evaluate for evidence of arrhythmia and/or significant heart rate changes.   Clinical Course as of 10/14/21 0810  Mon Oct 14, 2021  0328 After 1 L of fluid, the patient's blood pressure is 89/51.  She has no altered mental status and she is afebrile, but her blood work starting come back and she has a leukocytosis of 19.1.  She was tachypneic upon arrival as well and initially tachycardic at just over 100.  She meets sepsis criteria.  I ordered an additional 2 L of LR which is 30 mL/kg of actual body weight for a fluid goal of 3 L.  I also ordered empiric antibiotics of cefepime 2 g IV and vancomycin per pharmacy consult starting with 1 g. [CF]  0329 DG Chest [facility] I viewed and interpreted the patient's 1 view chest x-ray and I see no evidence of pneumonia nor pulmonary edema.  I also read the radiologist's report, which confirmed no acute findings. [CF]  0335 Lactic Acid, Venous(!!): 2.3 Further supports probable sepsis [CF]  0335 B Natriuretic Peptide: 8.6 Reassuring BNP [CF]  0424 Troponin I (High Sensitivity): 12 [CF]  0518 Troponin I (High Sensitivity): 7 Stable repeat high-sensitivity troponin [CF]  0518 Urinalysis, Complete w Microscopic Urine, Catheterized(!) I reviewed the urinalysis results.  There is rare bacteria and a few WBCs but does not appear to be overwhelming or obvious infection [CF]  0519 CK Total(!): 3,746 Consistent with rhabdomyolysis [CF]  0520 Ordered CT chest/abd/pelvis WO contrast to evaluate for possible intraabdominal infection, evidence of renal/urinary obstruction (or infected stone), PNA not seen on CXR, etc.  [CF]  0541 CT CHEST ABDOMEN PELVIS WO CONTRAST I viewed and interpreted  the patient's CT of her chest/abdomen/pelvis.  I  could not find any acute abnormalities.  I just reassessed her.  She still appears very dry and her blood pressure is borderline at best with a MAP of about 68.  She is still getting her IV fluids but is gotten most of 3 L and she is still hypotensive.  Under the circumstances I believe she meets criteria for the intensive care unit.  I consulted by phone with Domingo Pulse Rust-Chester, ICU NP, and we discussed the case in detail.  She agrees to admit the patient.  My main concern at this point is not only the renal failure and the rhabdomyolysis but the possibility of bacteremia given the open wounds on her backside which were covered in stool.  I discussed this with the ICU provider as well. [CF]  [phone] Contacted nephrology MDs (overnight and new morning doc) as well as ICU provider to let them know about the situation, and the fact that the patient may need dialysis given the severity of the acute renal failure. [CF]  [id number] Completed sepsis reassessment.  Vital signs improving though BP still low.  Neurologically intact.  Good peripheral circulations.  [CF]    Clinical Course User Index [CF] Hinda Kehr, MD     FINAL CLINICAL IMPRESSION(S) / ED DIAGNOSES   Final diagnoses:  Acute renal failure, unspecified acute renal failure type ([place])  Septic shock (HCC)  Pressure injury of skin of sacral region, unspecified injury stage     Rx / DC Orders   ED Discharge Orders     None        Note:  This document was prepared using Dragon voice recognition software and may include unintentional dictation errors.   Hinda Kehr, MD 10/14/21 [phone]

## 2021-10-14 NOTE — Consult Note (Signed)
WOC Nurse Consult Note: Patient receiving care in [facility] ICU 6. Assisted with turning by NT. Reason for Consult: wounds to sacrum, buttocks, thighs Wound type: All of these areas are a result of exposure to urine and feces. See photos. The bilateral buttocks has grey non-viable slough, thus the order for Medihoney Pressure Injury POA: Yes/No/NA.  These may evolve further into more substantial wounds. Measurement: The entire area affected on the bilateral buttocks measures 11 cm x 8 cm. See photos. Not all of this is grey, there is some pink. The right upper, posterior thigh MASD-IAD area measures 10 cm x 13 cm. The left upper, posterior thigh MASD-IAD area measures 12 cm x 10 cm. See photos of these areas. Wound bed: pink, shallow, some darkened areas, peeling skin. Drainage (amount, consistency, odor) none Periwound: intact Dressing procedure/placement/frequency: Wash bilateral buttock wounds with soap and water, pat dry. Apply a nickel thick layer of Medihoney on the wounds, cover with dry gauze, then foam dressings.  I have ordered tid Triple Paste for the thighs.  Monitor the wound area(s) for worsening of condition such as: Signs/symptoms of infection,  Increase in size,  Development of or worsening of odor, Development of pain, or increased pain at the affected locations.  Notify the medical team if any of these develop.  Thank you for the consult.  Discussed plan of care with the patient and bedside nurse.  WOC nurse will not follow at this time.  Please re-consult the WOC team if needed.  Helmut Muster, RN, MSN, CWOCN, CNS-BC, pager [phone]

## 2021-10-14 NOTE — H&P (Signed)
NAME:  Karen Ray, MRN:  [id number], DOB:  08-10-1965, LOS: 0 ADMISSION DATE:  10/14/2021, CONSULTATION DATE:  10/14/21 REFERRING MD:  Dr. Karma Greaser, CHIEF COMPLAINT:  Fall   History of Present Illness:  56 yo F presenting to [facility] ED on 10/14/21 via EMS after a mechanical fall at home.  The patient has been "sliding out" of her chair, landing on the floor. They recommended she come to the hospital to be evaluated, but the patient refused. Later on the same day, she was leaning over to charge her hover round and the chair flipped over, EMS arrived and assisted the patient back into the chair. She landed between hover round and the wall. She denies any recent sick symptoms including: new productive cough, urinary symptoms, nausea/vomiting/diarrhea.  She has difficulty taking care of ADL's and only has an aide come to buy groceries. She had old dried stool upon arrival to ED. She reports poor PO intake, stating the last thing she ate was bologna on Thursday and she has a few glasses of water/soda daily.  ED course: Work up for Sepsis per protocol, pan CT imaging unremarkable. Lab work revealed Rhabdomyolysis & acute renal failure with mild Transaminitis. Medications given: 3 L LR bolus, cefepime & vancomycin Initial Vitals: 97.6, tachypneic 24, 96, 108/52 & SpO2 100% on RA Significant labs: (Labs/ Imaging personally reviewed) Chemistry: Na+:141, K+: 5.6, BUN/Cr.: 88/ 7.54, Serum CO2/ AG: 13/ 16, AST/ALT: 84/ 46, albumin: 2.9 Hematology: WBC: 19.1, Hgb: 10.6,  Troponin: 12 > 7, BNP: 8.6, Lactic/ PCT: 2.3 > 1.2/ 1.39,  COVID-19: negative  CXR 10/14/21: no acute abnormality CT chest/abdomen/pelvis wo contrast 10/14/21: 2 cm cystic density arising from the right kidney.  PCCM consulted for admission due to labile hemodynamics and hypotension post IVF resuscitation with potential need for vasopressor support as well as acute renal failure s/t rhabdomyolysis with potential need for  dialysis.  Pertinent  Medical History  CKD Stage 3a HTN Sensorimotor neuropathy  Significant Hospital Events: Including procedures, antibiotic start and stop dates in addition to other pertinent events   09/11: Pt admitted following falls with acute on chronic renal failure with anion gap metabolic acidosis, rhabdomyolysis, and hypotension   Interim History / Subjective:  As outlined above   Objective   Blood pressure 96/85, pulse 97, temperature 97.6 F (36.4 C), temperature source Oral, resp. rate 19, height 5' 3" (1.6 m), weight 90.7 kg, SpO2 100 %.        Intake/Output Summary (Last 24 hours) at 10/14/2021 0544 Last data filed at 10/14/2021 0443 Gross per 24 hour  Intake 2100 ml  Output --  Net 2100 ml   Filed Weights   10/14/21 0110  Weight: 90.7 kg    Examination: General: Adult female, critically ill, lying in bed, NAD HEENT: MM pink/moist, anicteric, atraumatic, neck supple Neuro: A&O x 4, able to follow commands, PERRL +3, MAE CV: s1s2 RRR, NSR on monitor, no r/m/g Pulm: Regular, non labored on RA , breath sounds clear-BUL & clear/diminished-BLL GI: Soft, rounded, non tender, bs x 4 Skin: See below Unstageable sacral injury    Posterior bilateral buttocks/thighs excoriation  Extremities: warm/dry, pulses + 2 R/P, +1 edema noted BLE  Resolved Hospital Problem list     Assessment & Plan:  Rhabdomyolysis Acute Renal Failure superimposed on CKD Stage 3a secondary to rhabdomyolysis  AGMA Hyperkalemia Baseline Cr: 1.4 (most recent from nephro note 09/2020), Cr on admission: 7.54. CK: 3746. Patient received 3 L LR bolus - Strict  I/O's: alert provider if UOP < 0.5 mL/kg/hr - Daily BMP, replace electrolytes PRN - Avoid nephrotoxic agents as able, ensure adequate renal perfusion - Nephrology consulted, appreciate input > evaluate for CRRT/ iHD  - 2 amp of sodium bicarb followed by sodium bicarb gtt _0  ml/hr   Hypotension likely secondary to hypovolemia and  anion gap metabolic acidosis  - Continuous telemetry monitoring  - IV fluid resuscitation and/or vasopressor therapy to maintain map >65 - Hold outpatient antihypertensives   Leukocytosis Lactic Acidosis suspect s/t Rhabdomyolysis - Lactic has normalized 2.3 > 1.2, PCT elevated in the setting of renal failure no signs of infection no indication for abx at this time   Transaminitis secondary to hypotension  - Trend hepatic function - Avoid hepatotoxic agents  Falls  - CT Head pending  - PT/OT once bp stable   Unstageable pressure ulceration  Posterior thigh/buttocks excoriation - Turn q2hrs  - Wound care consulted appreciate input    Best Practice (right click and "Reselect all SmartList Selections" daily)  Diet/type: NPO DVT prophylaxis: systemic heparin GI prophylaxis: PPI Lines: N/A Foley:  N/A Code Status:  full code Last date of multidisciplinary goals of care discussion [10/14/21]  Notified pts son Latalia Etzler via telephone regarding pts hospitalization, prognosis, and plan of care all questions were answered Labs   CBC: Recent Labs  Lab 10/14/21 0236  WBC 19.1*  NEUTROABS 16.5*  HGB 10.6*  HCT 33.1*  MCV 84.0  PLT 664    Basic Metabolic Panel: Recent Labs  Lab 10/14/21 0236  NA 141  K 5.6*  CL 112*  CO2 13*  GLUCOSE 83  BUN 88*  CREATININE 7.54*  CALCIUM 8.8*   GFR: Estimated Creatinine Clearance: 8.7 mL/min (A) (by C-G formula based on SCr of 7.54 mg/dL (H)). Recent Labs  Lab 10/14/21 0236 10/14/21 0445  PROCALCITON 1.39  --   WBC 19.1*  --   LATICACIDVEN 2.3* 1.2    Liver Function Tests: Recent Labs  Lab 10/14/21 0236  AST 84*  ALT 46*  ALKPHOS 91  BILITOT 0.9  PROT 6.5  ALBUMIN 2.9*   Recent Labs  Lab 10/14/21 0236  LIPASE 38   No results for input(s): "AMMONIA" in the last 168 hours.  ABG No results found for: "PHART", "PCO2ART", "PO2ART", "HCO3", "TCO2", "ACIDBASEDEF", "O2SAT"   Coagulation Profile: Recent Labs   Lab 10/14/21 0236  INR 1.4*    Cardiac Enzymes: Recent Labs  Lab 10/14/21 0236  CKTOTAL 3,746*    HbA1C: No results found for: "HGBA1C"  CBG: No results for input(s): "GLUCAP" in the last 168 hours.  Review of Systems: Positives in BOLD  Gen: poor po intake, fever, chills, weight change, fatigue, night sweats HEENT: Denies blurred vision, double vision, hearing loss, tinnitus, sinus congestion, rhinorrhea, sore throat, neck stiffness, dysphagia PULM: Denies shortness of breath, cough, sputum production, hemoptysis, wheezing CV: Denies chest pain, edema, orthopnea, paroxysmal nocturnal dyspnea, palpitations GI: Denies abdominal pain, nausea, vomiting, diarrhea, hematochezia, melena, constipation, change in bowel habits GU: Denies dysuria, hematuria, polyuria, oliguria, urethral discharge Endocrine: Denies hot or cold intolerance, polyuria, polyphagia or appetite change Derm: Denies rash, dry skin, scaling or peeling skin change Heme: Denies easy bruising, bleeding, bleeding gums Neuro: falls, headache, numbness, weakness, slurred speech, loss of memory or consciousness  Past Medical History:  She,  has a past medical history of Hypertension and Kidney disease, chronic, stage III (moderate, EGFR 30-59 ml/min) ([place]).   Surgical History:  No past surgical history on  file.   Social History:   reports that she quit smoking about 23 years ago. Her smoking use included cigarettes. She has a 0.50 pack-year smoking history. She has never used smokeless tobacco.   Family History:  Her family history includes Breast cancer in her mother.   Allergies No Known Allergies   Home Medications  Prior to Admission medications   Medication Sig Start Date End Date Taking? Authorizing Provider  amLODipine (NORVASC) 5 MG tablet Take 5 mg by mouth daily. Patient not taking: Reported on 10/14/2021    [provider]  aspirin EC 81 MG tablet Take 81 mg by mouth daily. Patient not  taking: Reported on 10/14/2021    [provider]  chlorthalidone (HYGROTON) 25 MG tablet Take 25 mg by mouth daily. Patient not taking: Reported on 10/14/2021 08/11/18   [provider]  gabapentin (NEURONTIN) 100 MG capsule Take 1 tablet by mouth 2 (two) times daily. Patient not taking: Reported on 10/14/2021 04/21/19   [provider]  lisinopril (ZESTRIL) 20 MG tablet Take 20 mg by mouth daily. Patient not taking: Reported on 10/14/2021    [provider]  Vitamin D, Ergocalciferol, (DRISDOL) 1.25 MG (50000 UNIT) CAPS capsule Take 50,000 Units by mouth once a week. Patient not taking: Reported on 10/14/2021 05/29/21   [provider]     Critical care time: 60 minutes        Donell Beers, [place] Pager [phone] (please enter 7 digits) PCCM Consult Pager [phone] (please enter 7 digits)

## 2021-10-14 NOTE — Progress Notes (Signed)
PHARMACY -  BRIEF ANTIBIOTIC NOTE   Pharmacy has received consult(s) for Cefepime & Vancomycin from an ED provider.  The patient's profile has been reviewed for ht/wt/allergies/indication/available labs.    One time order(s) placed for Cefepime 2 gm and Vancomycin 2 gm per pt wt: 90.7 kg  Further antibiotics/pharmacy consults should be ordered by admitting physician if indicated.                       Thank you, Otelia Sergeant, PharmD, [facility] 10/14/2021 3:45 AM

## 2021-10-14 NOTE — Evaluation (Signed)
Physical Therapy Evaluation Patient Details Name: Karen Ray MRN: [id number] DOB: 07-05-1965 Today's Date: 10/14/2021  History of Present Illness  Karen Ray is a 56 y.o. female whose medical history includes only hypertension and possible relatively minor chronic kidney disease.  She has also had some numbness and tingling and a right leg for which she has seen neurology. She presents tonight by EMS for evaluation after a fall or possibly 2 falls.  History is limited, but reportedly she fell earlier today and refused to come to the emergency department. She then fell a second time and was found between the couch and her motorized wheelchair.  She is denying any acute pain and she says she has no shortness of breath.  She has extensive amounts of dried stool on her backside  Clinical Impression  Pt is a pleasant 56 year old female who was admitted for rhabdomyolysis. Pt performs bed mobility with total assist and is very deconditioned. Pt usually sleeps in lift chair and has difficulty with ADLs and living independently. Pt demonstrates deficits with endurance/mobility/strength. Pt doesn't demonstrate sufficient strength to perform transfers at this time and would need 2 people for safety with all OOB mobility. Would benefit from skilled PT to address above deficits and promote optimal return to PLOF; recommend transition to STR upon discharge from acute hospitalization.       Recommendations for follow up therapy are one component of a multi-disciplinary discharge planning process, led by the attending physician.  Recommendations may be updated based on patient status, additional functional criteria and insurance authorization.  Follow Up Recommendations Skilled nursing-short term rehab (<3 hours/day) Can patient physically be transported by private vehicle: No    Assistance Recommended at Discharge Frequent or constant Supervision/Assistance  Patient can return home with the  following  Two people to help with walking and/or transfers;Two people to help with bathing/dressing/bathroom    Equipment Recommendations Hospital bed  Recommendations for Other Services       Functional Status Assessment Patient has had a recent decline in their functional status and demonstrates the ability to make significant improvements in function in a reasonable and predictable amount of time.     Precautions / Restrictions Precautions Precautions: Fall Restrictions Weight Bearing Restrictions: No      Mobility  Bed Mobility Overal bed mobility: Needs Assistance Bed Mobility: Supine to Sit, Sit to Supine     Supine to sit: Total assist     General bed mobility comments: unable to acheive complete EOB due to pain/weakness. Total assist required. Unable to further tolerate mobility    Transfers                        Ambulation/Gait                  Stairs            Wheelchair Mobility    Modified Rankin (Stroke Patients Only)       Balance                                             Pertinent Vitals/Pain Pain Assessment Pain Assessment: Faces Faces Pain Scale: Hurts even more Pain Location: B LE Pain Descriptors / Indicators: Aching Pain Intervention(s): Limited activity within patient's tolerance, Repositioned    Home Living Family/patient expects to be discharged  to:: Private residence Living Arrangements: Alone Available Help at Discharge: Personal care attendant Type of Home: Apartment Home Access: Level entry       Home Layout: One level Home Equipment: Cane - single Librarian, academic (2 wheels);Shower seat;Wheelchair - Pharmacist, hospital Comments: Pt uses a hoveround chair to [employer] in home.    Prior Function Prior Level of Function : Needs assist       Physical Assist : Mobility (physical);ADLs (physical) Mobility (physical): Bed mobility;Transfers ADLs (physical):  Grooming;Bathing;Dressing;Toileting;IADLs Mobility Comments: pt doesn't ambulate, however performs transfers between chair and electric scooter ADLs Comments: has difficulty with ADLs, sponge baths, skin breakdown noted     Hand Dominance        Extremity/Trunk Assessment   Upper Extremity Assessment Upper Extremity Assessment: Generalized weakness (B UE grossly 3+/5)    Lower Extremity Assessment Lower Extremity Assessment: Generalized weakness (trace muscle activation 1/5)       Communication   Communication: No difficulties  Cognition Arousal/Alertness: Awake/alert Behavior During Therapy: WFL for tasks assessed/performed Overall Cognitive Status: Within Functional Limits for tasks assessed                                 General Comments: pleasant and agreeable to sessin        General Comments      Exercises Other Exercises Other Exercises: attempted ther-ex on B LE including AP, quad sets, and glut sets. Trace muscle activiation. Educated on continuing ther-ex 10 reps Other Exercises: weeping edema noted; B pillows placed   Assessment/Plan    PT Assessment Patient needs continued PT services  PT Problem List Decreased strength;Decreased activity tolerance;Decreased balance;Decreased mobility;Pain       PT Treatment Interventions Gait training;DME instruction;Therapeutic exercise;Balance training    PT Goals (Current goals can be found in the Care Plan section)  Acute Rehab PT Goals Patient Stated Goal: to get stronger PT Goal Formulation: With patient Time For Goal Achievement: 10/28/21 Potential to Achieve Goals: Good    Frequency Min 2X/week     Co-evaluation               AM-PAC PT "6 Clicks" Mobility  Outcome Measure Help needed turning from your back to your side while in a flat bed without using bedrails?: Total Help needed moving from lying on your back to sitting on the side of a flat bed without using bedrails?:  Total Help needed moving to and from a bed to a chair (including a wheelchair)?: Total Help needed standing up from a chair using your arms (e.g., wheelchair or bedside chair)?: Total Help needed to walk in hospital room?: Total Help needed climbing 3-5 steps with a railing? : Total 6 Click Score: 6    End of Session   Activity Tolerance: Patient limited by pain Patient left: in bed Nurse Communication: Mobility status PT Visit Diagnosis: Muscle weakness (generalized) (M62.81);Repeated falls (R29.6);Difficulty in walking, not elsewhere classified (R26.2)    Time: 1551-1610 PT Time Calculation (min) (ACUTE ONLY): 19 min   Charges:   PT Evaluation $PT Eval Moderate Complexity: 1 Mod PT Treatments $Therapeutic Exercise: 8-22 mins        Elizabeth Palau, PT, DPT, GCS [phone]   Kamarrion Stfort 10/14/2021, 4:58 PM

## 2021-10-14 NOTE — Progress Notes (Signed)
Patients blood sugar 68. Per Annabelle Harman NP give patient orange juice. Continue to assess.

## 2021-10-14 NOTE — ED Triage Notes (Signed)
Arrived via EMS from home. Reports patient fell earlier today but refused to come to ER. Patient fell a second time and was found between couch and motorized chair on knees. Denies hitting head, denies LOC. Denies pain or arrival to ED. Resp even, unlabored on RA. AOX4. Foul odor noted on arrival.

## 2021-10-14 NOTE — Progress Notes (Signed)
Initial Nutrition Assessment  DOCUMENTATION CODES:   Morbid obesity  INTERVENTION:   Ensure Enlive po TID, each supplement provides 350 kcal and 20 grams of protein.  MVI po daily  Vitamin C 500mg  po BID  Liberalize diet   Pt at high refeed risk; recommend monitor potassium, magnesium and phosphorus labs daily until stable  Check vitamins B1, B12, folate, Iron, TIBC, ferritin, D, A, E, zinc, B6 and copper labs.   NUTRITION DIAGNOSIS:   Increased nutrient needs related to wound healing as evidenced by estimated needs.  GOAL:   Patient will meet greater than or equal to 90% of their needs  MONITOR:   PO intake, Supplement acceptance, Labs, Weight trends, Skin, I & O's  REASON FOR ASSESSMENT:   Consult Assessment of nutrition requirement/status  ASSESSMENT:   56 y/o female with h/o HTN, CKD III, neuropathy and falls who is admitted with falls, rhabdomyolysis and AKI.  Unable to see patient today. Discussed with MD and NP in rounds. Pt with weakness, falls, neuropathy and foot drop. Will check vitamin labs to r/o deficiency. RD will add supplements and vitamins to help pt meet her estimated needs and to support wound healing. RD will obtain nutrition related history and exam at follow-up.   There is no recent weight history documented in chart to determine if any significant recent weight changes. Pt's UBW appears to be ~240lbs.   Medications reviewed and include: vitamin C, aspirin, heparin, MVI, protonix, Na Bicarbonate   Labs reviewed: K 5.2(H), BUN 96(H), creat 6.26(H) Wbc- 19.1(H), Hgb 10.6(L), Hct 33.1(L) Cbgs- 70, 68 x 24 hrs  NUTRITION - FOCUSED PHYSICAL EXAM: Unable to perform at this time   Diet Order:   Diet Order             Diet regular Room service appropriate? Yes; Fluid consistency: Thin  Diet effective now                  EDUCATION NEEDS:   Education needs have been addressed  Skin:  Skin Assessment: Reviewed RN Assessment (bilateral  buttocks  11 cm x 8 cm, right upper, posterior thigh MASD-IAD area measures 10 cm x 13 cm, left upper, posterior thigh MASD-IAD area measures 12 cm x 10 cm.)  Last BM:  pta  Height:   Ht Readings from Last 1 Encounters:  10/14/21 5\' 3"  (1.6 m)    Weight:   Wt Readings from Last 1 Encounters:  10/14/21 107.1 kg    Ideal Body Weight:  52.2 kg  BMI:  Body mass index is 41.83 kg/m.  Estimated Nutritional Needs:   Kcal:  2000-2300kcal/day  Protein:  100-115g/day  Fluid:  1.6-1.8L/day  MS, RD, LDN Please refer to [facility] for RD and/or RD on-call/weekend/after hours pager

## 2021-10-14 NOTE — ED Notes (Signed)
Pt report attempted, RN not available at this time.

## 2021-10-14 NOTE — Evaluation (Signed)
Occupational Therapy Evaluation Patient Details Name: Karen Ray MRN: [id number] DOB: 04-25-1965 Today's Date: 10/14/2021   History of Present Illness Karen Ray is a 56 y.o. female whose medical history includes only hypertension and possible relatively minor chronic kidney disease.  She has also had some numbness and tingling and a right leg for which she has seen neurology. She presents tonight by EMS for evaluation after a fall or possibly 2 falls.  History is limited, but reportedly she fell earlier today and refused to come to the emergency department. She then fell a second time and was found between the couch and her motorized wheelchair.  She is denying any acute pain and she says she has no shortness of breath.  She has extensive amounts of dried stool on her backside   Clinical Impression   Patient presenting with decreased independence in self-care, functional mobility, endurance, safety, and balance. Pt in bed upon arrival with nurse in room administering medication, and daughter-in-law present and assisting in providing patients PLOF. Daughter-in-law states the aid is scheduled to come everyday to assist pt, but she has only been coming 1x/week to bring groceries (an arrangement made by the patient and aid; daughter-in-law unaware of arrangement until now). Daughter-in-law states patient does not like to ask for help when needed. Pt reports she has a shower chair, SPC, 2WW, electric scooter, and a lift chair that she sleeps in. She reports she lifts and transfers herself. Pt also reports she wears briefs and completes toileting hygiene to the best of her ability when aid is not present. Patient currently functioning at total A for bed mobility and transfers sit <> stand. Pt was able to wash face EOB, supported by therapist. Patients BP was checked while sitting EOB 93/47. Air mattress requested for wounds/pressure relief on bottom.  Patient will benefit from acute OT to increase  overall independence in the areas of ADLs, functional mobility, in order to safely discharge to next venue of care.       Recommendations for follow up therapy are one component of a multi-disciplinary discharge planning process, led by the attending physician.  Recommendations may be updated based on patient status, additional functional criteria and insurance authorization.   Follow Up Recommendations  Skilled nursing-short term rehab (<3 hours/day)    Assistance Recommended at Discharge Frequent or constant Supervision/Assistance  Patient can return home with the following A lot of help with walking and/or transfers;A lot of help with bathing/dressing/bathroom;Assist for transportation;Assistance with cooking/housework    Functional Status Assessment  Patient has had a recent decline in their functional status and/or demonstrates limited ability to make significant improvements in function in a reasonable and predictable amount of time  Equipment Recommendations  Other (comment) (Defer to next venue of care.)    Recommendations for Other Services       Precautions / Restrictions Precautions Precautions: Fall Restrictions Weight Bearing Restrictions: No      Mobility Bed Mobility Overal bed mobility: Needs Assistance Bed Mobility: Supine to Sit, Sit to Supine     Supine to sit: Total assist, +2 for physical assistance Sit to supine: Total assist, +2 for physical assistance        Transfers                          Balance Overall balance assessment: Needs assistance Sitting-balance support: Feet supported, Bilateral upper extremity supported Sitting balance-Leahy Scale: Poor  ADL either performed or assessed with clinical judgement   ADL Overall ADL's : Needs assistance/impaired     Grooming: Supervision/safety;Set up;Wash/dry face                                 General ADL Comments:  Pt required frequent assistance for all ADL tasks. Pt has an aid the come in to assist with all ADLs.      Extremity/Trunk Assessment Upper Extremity Assessment Upper Extremity Assessment: Generalized weakness   Lower Extremity Assessment Lower Extremity Assessment: Generalized weakness       Communication Communication Communication: No difficulties   Cognition Arousal/Alertness: Awake/alert Behavior During Therapy: WFL for tasks assessed/performed Overall Cognitive Status: Within Functional Limits for tasks assessed                                                  Home Living Family/patient expects to be discharged to:: Private residence Living Arrangements: Alone Available Help at Discharge: Personal care attendant Type of Home: Apartment Home Access: Level entry     Home Layout: One level     Bathroom Shower/Tub: Teacher, early years/pre: Standard     Home Equipment: Cane - single Barista (2 wheels);Shower seat;Wheelchair - Media planner Comments: Pt uses a hoveround chair to [employer] in home.      Prior Functioning/Environment Prior Level of Function : Needs assist       Physical Assist : Mobility (physical);ADLs (physical) Mobility (physical): Bed mobility;Transfers ADLs (physical): Grooming;Bathing;Dressing;Toileting;IADLs            OT Problem List: Decreased strength;Decreased activity tolerance;Impaired UE functional use;Impaired balance (sitting and/or standing);Decreased safety awareness      OT Treatment/Interventions: Self-care/ADL training;Therapeutic exercise;Patient/family education;Energy conservation;Therapeutic activities;DME and/or AE instruction    OT Goals(Current goals can be found in the care plan section) Acute Rehab OT Goals Patient Stated Goal: to return home. OT Goal Formulation: With patient Time For Goal Achievement: 10/28/21 Potential to Achieve Goals:  Fair ADL Goals Pt Will Perform Grooming: with mod assist Pt Will Perform Upper Body Bathing: with mod assist;with adaptive equipment Pt Will Perform Lower Body Bathing: with mod assist;with adaptive equipment Pt Will Perform Lower Body Dressing: with mod assist;with adaptive equipment Pt Will Transfer to Toilet: squat pivot transfer;with mod assist  OT Frequency: Min 2X/week       AM-PAC OT "6 Clicks" Daily Activity     Outcome Measure Help from another person eating meals?: None Help from another person taking care of personal grooming?: A Lot Help from another person toileting, which includes using toliet, bedpan, or urinal?: A Lot Help from another person bathing (including washing, rinsing, drying)?: Total Help from another person to put on and taking off regular upper body clothing?: A Lot Help from another person to put on and taking off regular lower body clothing?: A Lot 6 Click Score: 13   End of Session    Activity Tolerance: Patient limited by fatigue;Patient limited by lethargy;Patient limited by pain Patient left: in bed;with family/visitor present;with call bell/phone within reach;with bed alarm set  OT Visit Diagnosis: Unsteadiness on feet (R26.81);Repeated falls (R29.6);Muscle weakness (generalized) (M62.81);History of falling (Z91.81);Pain                Time: [id number]  OT Time Calculation (min): 25 min Charges:  OT General Charges $OT Visit: 1 Visit OT Evaluation $OT Eval Moderate Complexity: 1 Mod OT Treatments $Self Care/Home Management : 8-22 mins    Damarko Stitely, OTS 10/14/2021, 3:37 PM

## 2021-10-14 NOTE — Consult Note (Addendum)
[facility]  CONSULT NOTE    Date: 10/14/2021                  Patient Name:  Karen Ray  MRN: [id number]  DOB: [birth year]  Age / Sex: 56 y.o., female         PCP: Marguerita Merles, MD                 Service Requesting Consult: Critical care                 Reason for Consult: Acute kidney injury            History of Present Illness: Ms. Karen Ray is a 56 y.o.  female with past medical conditions including hypertension, sensorimotor nephropathy, and chronic kidney disease stage IIIa, who was admitted to [facility] on 10/14/2021 for Rhabdomyolysis [M62.82] Septic shock ([place]) [A41.9, R65.21] Acute renal failure, unspecified acute renal failure type ([place]) [N17.9] Pressure injury of skin of sacral region, unspecified injury stage [L89.159]  Patient has presented to the emergency department at the request of family due to frequent falls.  Patient states she has a electric chair that she sometimes falls out of.  Patient states she lives alone and has a son that lives nearby that checks on her frequently.  Patient does report more frequent falls at home and due to poor appetite, she is unable to get herself up independently.  Denies pain or discomfort.  Denies use of NSAIDs.  Denies nausea, vomiting, or diarrhea.  Denies known fever or chills.    Labs on ED arrival include potassium 5.6, serum bicarb 13, BUN 88, creatinine 7.54 albumin 2.9, CK 3746, lactic acid 2.3, elevated WBCs 19.1 with hemoglobin 10.6.  Chest x-ray negative.  Blood cultures pending.  Head CT shows age-related changes.  CT abdomen pelvis shows no renal obstruction however patient has 2 cm cyst in right kidney.  Lower extremity Doppler and echo pending.   Medications: Outpatient medications: Medications Prior to Admission  Medication Sig Dispense Refill Last Dose   amLODipine (NORVASC) 5 MG tablet Take 5 mg by mouth daily. (Patient not taking: Reported on 10/14/2021)   Not Taking   aspirin EC  81 MG tablet Take 81 mg by mouth daily. (Patient not taking: Reported on 10/14/2021)   Not Taking   chlorthalidone (HYGROTON) 25 MG tablet Take 25 mg by mouth daily. (Patient not taking: Reported on 10/14/2021)   Not Taking   gabapentin (NEURONTIN) 100 MG capsule Take 1 tablet by mouth 2 (two) times daily. (Patient not taking: Reported on 10/14/2021)   Not Taking   lisinopril (ZESTRIL) 20 MG tablet Take 20 mg by mouth daily. (Patient not taking: Reported on 10/14/2021)   Not Taking   Vitamin D, Ergocalciferol, (DRISDOL) 1.25 MG (50000 UNIT) CAPS capsule Take 50,000 Units by mouth once a week. (Patient not taking: Reported on 10/14/2021)   Not Taking    Current medications: Current Facility-Administered Medications  Medication Dose Route Frequency Provider Last Rate Last Admin   acetaminophen (TYLENOL) tablet 650 mg  650 mg Oral Q4H PRN Rust-Chester, Huel Cote, NP       aspirin EC tablet 81 mg  81 mg Oral Daily Teressa Lower, NP       Chlorhexidine Gluconate Cloth 2 % PADS 6 each  6 each Topical Daily Flora Lipps, MD   6 each at 10/14/21 0959   docusate sodium (COLACE) capsule 100 mg  100 mg  Oral BID PRN Rust-Chester, Toribio Harbour L, NP       heparin injection 5,000 Units  5,000 Units Subcutaneous Q8H Rust-Chester, Britton L, NP       leptospermum manuka honey (MEDIHONEY) paste 1 Application  1 Application Topical Daily Kasa, Kurian, MD       pantoprazole (PROTONIX) EC tablet 40 mg  40 mg Oral Daily Rust-Chester, Britton L, NP   40 mg at 10/14/21 0957   polyethylene glycol (MIRALAX / GLYCOLAX) packet 17 g  17 g Oral Daily PRN Rust-Chester, Britton L, NP       sodium bicarbonate 150 mEq in dextrose 5 % 1,150 mL infusion   Intravenous Continuous Teressa Lower, NP 100 mL/hr at 10/14/21 0819 New Bag at 10/14/21 0819   zinc oxide 20 % ointment   Topical TID Madueme, Elvira C, RPH          Allergies: No Known Allergies    Past Medical History: Past Medical History:  Diagnosis Date   Hypertension     Kidney disease, chronic, stage III (moderate, EGFR 30-59 ml/min) (HCC)      Past Surgical History: No past surgical history on file.   Family History: Family History  Problem Relation Age of Onset   Breast cancer Mother      Social History: Social History   Socioeconomic History   Marital status: Divorced    Spouse name: Not on file   Number of children: Not on file   Years of education: Not on file   Highest education level: Not on file  Occupational History   Not on file  Tobacco Use   Smoking status: Former    Packs/day: 0.25    Years: 2.00    Total pack years: 0.50    Types: Cigarettes    Quit date: 02/03/1998    Years since quitting: 23.7   Smokeless tobacco: Never  Vaping Use   Vaping Use: Never used  Substance and Sexual Activity   Alcohol use: Not on file   Drug use: Not on file   Sexual activity: Not on file  Other Topics Concern   Not on file  Social History Narrative   Not on file   Social Determinants of Health   Financial Resource Strain: Not on file  Food Insecurity: Not on file  Transportation Needs: Not on file  Physical Activity: Not on file  Stress: Not on file  Social Connections: Not on file  Intimate Partner Violence: Not on file     Review of Systems: Review of Systems  Constitutional:  Negative for chills, fever and malaise/fatigue.  HENT:  Negative for congestion, sore throat and tinnitus.   Eyes:  Negative for blurred vision and redness.  Respiratory:  Negative for cough, shortness of breath and wheezing.   Cardiovascular:  Negative for chest pain, palpitations, claudication and leg swelling.  Gastrointestinal:  Negative for abdominal pain, blood in stool, diarrhea, nausea and vomiting.  Genitourinary:  Negative for flank pain, frequency and hematuria.  Musculoskeletal:  Positive for falls. Negative for back pain and myalgias.  Skin:  Negative for rash.  Neurological:  Positive for weakness. Negative for dizziness and  headaches.  Endo/Heme/Allergies:  Does not bruise/bleed easily.  Psychiatric/Behavioral:  Negative for depression. The patient is not nervous/anxious and does not have insomnia.     Vital Signs: Blood pressure (!) 108/59, pulse (!) 105, temperature 98 F (36.7 C), temperature source Oral, resp. rate (!) 22, height $RemoveBe'5\' 3"'OlrklzgUr$  (1.6 m), weight 107.1 kg,  SpO2 100 %.  Weight trends: Filed Weights   10/14/21 0110 10/14/21 0800  Weight: 90.7 kg 107.1 kg    Physical Exam: General: NAD, resting comfortably  Head: Normocephalic, atraumatic. Moist oral mucosal membranes  Eyes: Anicteric  Lungs:  Clear to auscultation, normal effort, room air  Heart: Irregular rate and rhythm  Abdomen:  Soft, nontender, nondistended  Extremities: 1+ peripheral edema.  Neurologic: Nonfocal, moving all four extremities  Skin: No lesions  Access: None     Lab results: Basic Metabolic Panel: Recent Labs  Lab 10/14/21 0236 10/14/21 0943  NA 141 141  K 5.6* 5.2*  CL 112* 112*  CO2 13* 15*  GLUCOSE 83 79  BUN 88* 96*  CREATININE 7.54* 6.26*  CALCIUM 8.8* 8.9    Liver Function Tests: Recent Labs  Lab 10/14/21 0236  AST 84*  ALT 46*  ALKPHOS 91  BILITOT 0.9  PROT 6.5  ALBUMIN 2.9*   Recent Labs  Lab 10/14/21 0236  LIPASE 38   No results for input(s): "AMMONIA" in the last 168 hours.  CBC: Recent Labs  Lab 10/14/21 0236  WBC 19.1*  NEUTROABS 16.5*  HGB 10.6*  HCT 33.1*  MCV 84.0  PLT 303    Cardiac Enzymes: Recent Labs  Lab 10/14/21 0236  CKTOTAL 3,746*    BNP: Invalid input(s): "POCBNP"  CBG: Recent Labs  Lab 10/14/21 0753 10/14/21 0857  GLUCAP 56* 34    Microbiology: Results for orders placed or performed during the hospital encounter of 10/14/21  Blood Culture (routine x 2)     Status: None (Preliminary result)   Collection Time: 10/14/21  1:38 AM   Specimen: BLOOD  Result Value Ref Range Status   Specimen Description BLOOD RIGHT HAND  Final   Special  Requests   Final    BOTTLES DRAWN AEROBIC AND ANAEROBIC Blood Culture results may not be optimal due to an inadequate volume of blood received in culture bottles   Culture   Final    NO GROWTH < 12 HOURS Performed at [facility], [address], [place],  [address]    Report Status PENDING  Incomplete  SARS Coronavirus 2 by RT PCR (hospital order, performed in [place] hospital lab) *cepheid single result test* Anterior Nasal Swab     Status: None   Collection Time: 10/14/21  1:38 AM   Specimen: Anterior Nasal Swab  Result Value Ref Range Status   SARS Coronavirus 2 by RT PCR NEGATIVE NEGATIVE Final    Comment: (NOTE) SARS-CoV-2 target nucleic acids are NOT DETECTED.  The SARS-CoV-2 RNA is generally detectable in upper and lower respiratory specimens during the acute phase of infection. The lowest concentration of SARS-CoV-2 viral copies this assay can detect is 250 copies / mL. A negative result does not preclude SARS-CoV-2 infection and should not be used as the sole basis for treatment or other patient management decisions.  A negative result may occur with improper specimen collection / handling, submission of specimen other than nasopharyngeal swab, presence of viral mutation(s) within the areas targeted by this assay, and inadequate number of viral copies (<250 copies / mL). A negative result must be combined with clinical observations, patient history, and epidemiological information.  Fact Sheet for Patients:   https://www.patel.info/  Fact Sheet for Healthcare Providers: https://hall.com/  This test is not yet approved or  cleared by the [place] FDA and has been authorized for detection and/or diagnosis of SARS-CoV-2 by FDA under an Emergency Use Authorization (  EUA).  This EUA will remain in effect (meaning this test can be used) for the duration of the COVID-19 declaration under Section 564(b)(1) of  the Act, 21 U.S.C. section 360bbb-3(b)(1), unless the authorization is terminated or revoked sooner.  Performed at [facility], [address] [place], [place] [address]   Blood Culture (routine x 2)     Status: None (Preliminary result)   Collection Time: 10/14/21  2:15 AM   Specimen: BLOOD  Result Value Ref Range Status   Specimen Description BLOOD LEFT HAND  Final   Special Requests   Final    BOTTLES DRAWN AEROBIC AND ANAEROBIC Blood Culture results may not be optimal due to an inadequate volume of blood received in culture bottles   Culture   Final    NO GROWTH < 12 HOURS Performed at [facility], [address] [place], [place] [address]    Report Status PENDING  Incomplete  MRSA Next Gen by PCR, Nasal     Status: None   Collection Time: 10/14/21  8:13 AM   Specimen: Nasal Mucosa; Nasal Swab  Result Value Ref Range Status   MRSA by PCR Next Gen NOT DETECTED NOT DETECTED Final    Comment: (NOTE) The GeneXpert MRSA Assay (FDA approved for NASAL specimens only), is one component of a comprehensive MRSA colonization surveillance program. It is not intended to diagnose MRSA infection nor to guide or monitor treatment for MRSA infections. Test performance is not FDA approved in patients less than 58 years old. Performed at [facility], [address] [place], [place] [address]     Coagulation Studies: Recent Labs    10/14/21 0236  LABPROT 16.9*  INR 1.4*    Urinalysis: Recent Labs    10/14/21 0138  COLORURINE AMBER*  LABSPEC 1.023  PHURINE 5.0  GLUCOSEU NEGATIVE  HGBUR MODERATE*  BILIRUBINUR NEGATIVE  KETONESUR NEGATIVE  PROTEINUR 30*  NITRITE NEGATIVE  LEUKOCYTESUR NEGATIVE      Imaging: CT HEAD WO CONTRAST (5MM)  Result Date: 10/14/2021 CLINICAL DATA:  Mental status change.  Fall.  A EXAM: CT HEAD WITHOUT CONTRAST TECHNIQUE: Contiguous axial images were obtained from the base of the skull through the vertex without  intravenous contrast. RADIATION DOSE REDUCTION: This exam was performed according to the departmental dose-optimization program which includes automated exposure control, adjustment of the mA and/or kV according to patient size and/or use of iterative reconstruction technique. COMPARISON:  MRI 03/25/2019 FINDINGS: Brain: Generalized atrophic changes, advanced for age. No focal abnormality affecting the brainstem or cerebellum. Moderate to marked chronic small-vessel ischemic changes of the cerebral hemispheric white matter. No sign of acute infarction. No mass, hemorrhage, hydrocephalus or extra-axial collection Vascular: There is atherosclerotic calcification of the major vessels at the base of the brain. Skull: Negative Sinuses/Orbits: Clear/normal Other: None IMPRESSION: No acute or traumatic finding. Age advanced brain volume loss. Moderate to marked chronic small-vessel ischemic changes of the cerebral hemispheric white matter. Electronically Signed   By: Nelson Chimes M.D.   On: 10/14/2021 07:50   CT CHEST ABDOMEN PELVIS WO CONTRAST  Result Date: 10/14/2021 CLINICAL DATA:  Sepsis EXAM: CT CHEST, ABDOMEN AND PELVIS WITHOUT CONTRAST TECHNIQUE: Multidetector CT imaging of the chest, abdomen and pelvis was performed following the standard protocol without IV contrast. RADIATION DOSE REDUCTION: This exam was performed according to the departmental dose-optimization program which includes automated exposure control, adjustment of the mA and/or kV according to patient size and/or use of iterative reconstruction technique. COMPARISON:  03/04/2007 chest CT FINDINGS: CT CHEST FINDINGS Cardiovascular: No significant vascular findings. Normal heart size. No pericardial effusion. Mediastinum/Nodes: Negative for adenopathy or mass. Mild dependent atelectasis Lungs/Pleura: There is no edema, consolidation, effusion, or pneumothorax. Musculoskeletal: No acute finding CT ABDOMEN PELVIS FINDINGS Hepatobiliary: No focal liver  abnormality.No evidence of biliary obstruction or stone. Pancreas: Unremarkable. Spleen: Unremarkable. Adrenals/Urinary Tract: Negative adrenals. No hydronephrosis or stone. 2 cm cystic density arising from the right kidney. Unremarkable bladder. Stomach/Bowel: No obstruction. No appendicitis. Rectal distant structure at stool distended rectum. No generalized stool retention. Vascular/Lymphatic: No acute vascular abnormality. No mass or adenopathy. Reproductive:No pathologic findings. Other: No ascites or pneumoperitoneum. Musculoskeletal: No acute abnormalities. Degenerative facet spurring at the lower lumbar levels. IMPRESSION: No acute finding or explanation for sepsis. Electronically Signed   By: Jorje Guild M.D.   On: 10/14/2021 05:28   DG Chest Port 1 View  Result Date: 10/14/2021 CLINICAL DATA:  Multiple falls; questionable sepsis EXAM: PORTABLE CHEST 1 VIEW COMPARISON:  Radiographs 02/26/2007 FINDINGS: No focal consolidation, pleural effusion, or pneumothorax. Normal cardiomediastinal silhouette. No acute osseous abnormality. No displaced rib fractures. IMPRESSION: No acute abnormality. Electronically Signed   By: Placido Sou M.D.   On: 10/14/2021 01:52     Assessment & Plan: Ms. Karen Ray is a 56 y.o.  female with past medical conditions including hypertension, sensorimotor nephropathy, and chronic kidney disease stage IIIa, who was admitted to [facility] on 10/14/2021 for Rhabdomyolysis [M62.82] Septic shock ([place]) [A41.9, R65.21] Acute renal failure, unspecified acute renal failure type ([place]) [N17.9] Pressure injury of skin of sacral region, unspecified injury stage [L89.159]  Acute kidney injury with hyperkalemia on chronic kidney disease stage IIIa.  Baseline creatinine appears to be 2.3 from 10/03/2020.  Appears patient is followed by [facility] nephrology.  CT abdomen pelvis negative for obstruction however patient has 2 cm cyst in right kidney.Acute kidney injury likely secondary to  hypovolemia with a component of Rhabdomylitis. Potassium on admission 5.6.  Treated with hyperkalemia protocol and is currently 5.2. Labs have improved with current treatment with adequate urine output. No acute need for dialysis at this time. Agree with IVFs and avoidance of nephrotoxic agents.   2.  Hypertension with chronic kidney disease.  Home regimen includes amlodipine, chlorthalidone, and lisinopril.  These medications currently held.  Blood pressure currently soft, 98/52.    3.  Acute metabolic acidosis.  Serum bicarb on admission 13.  Primary team has started sodium bicarb infusion. Slowly improving.     LOS: 0 Ima Hafner 9/11/202311:05 AM

## 2021-10-14 NOTE — NC FL2 (Signed)
[place] MEDICAID FL2 LEVEL OF CARE SCREENING TOOL     IDENTIFICATION  Patient Name: Karen Ray Birthdate: 1965-02-09 Sex: female Admission Date (Current Location): 10/14/2021  [facility] and [place] Number:  Chiropodist and Address:  [facility], [address] [place], [place] [address]      Provider Number: [id number]  Attending Physician Name and Address:  Erin Fulling, MD  Relative Name and Phone Number:  Felicie Morn (daughter) [phone]    Current Level of Care: Hospital Recommended Level of Care: Skilled Nursing Facility Prior Approval Number:    Date Approved/Denied:   PASRR Number: [id number]  Discharge Plan: SNF    Current Diagnoses: Patient Active Problem List   Diagnosis Date Noted   Pressure injury of skin 10/14/2021   Rhabdomyolysis 10/14/2021    Orientation RESPIRATION BLADDER Height & Weight     Self, Situation, Time, Place  Normal Incontinent Weight: 107.1 kg Height:  5\' 3"  (160 cm)  BEHAVIORAL SYMPTOMS/MOOD NEUROLOGICAL BOWEL NUTRITION STATUS      Incontinent Diet (see discharge summary)  AMBULATORY STATUS COMMUNICATION OF NEEDS Skin   Extensive Assist Verbally PU Stage and Appropriate Care (see wound care)                       Personal Care Assistance Level of Assistance  Bathing, Feeding, Dressing Bathing Assistance: Maximum assistance Feeding assistance: Limited assistance Dressing Assistance: Maximum assistance     Functional Limitations Info  Sight, Hearing, Speech Sight Info: Adequate Hearing Info: Adequate Speech Info: Adequate    SPECIAL CARE FACTORS FREQUENCY  PT (By licensed PT), OT (By licensed OT)     PT Frequency: 5 times per week OT Frequency: 5 times per week            Contractures Contractures Info: Not present    Additional Factors Info  Code Status, Allergies Code Status Info: Full Allergies Info: NKA           Current Medications (10/14/2021):   This is the current hospital active medication list Current Facility-Administered Medications  Medication Dose Route Frequency Provider Last Rate Last Admin   acetaminophen (TYLENOL) tablet 650 mg  650 mg Oral Q4H PRN Rust-Chester, 12/14/2021, NP       [START ON 10/15/2021] ascorbic acid (VITAMIN C) tablet 500 mg  500 mg Oral BID 12/15/2021, MD       aspirin EC tablet 81 mg  81 mg Oral Daily Erin Fulling, NP   81 mg at 10/14/21 1406   Chlorhexidine Gluconate Cloth 2 % PADS 6 each  6 each Topical Daily 12/14/21, MD   6 each at 10/14/21 0959   docusate sodium (COLACE) capsule 100 mg  100 mg Oral BID PRN Rust-Chester, 12/14/21, NP       feeding supplement (ENSURE ENLIVE / ENSURE PLUS) liquid 237 mL  237 mL Oral TID BM Cecelia Byars, MD   237 mL at 10/14/21 1404   heparin injection 5,000 Units  5,000 Units Subcutaneous Q8H Rust-Chester, Britton L, NP   5,000 Units at 10/14/21 1404   leptospermum manuka honey (MEDIHONEY) paste 1 Application  1 Application Topical Daily 12/14/21, MD   1 Application at 10/14/21 1502   midodrine (PROAMATINE) tablet 10 mg  10 mg Oral TID WC 12/14/21, NP       [START ON 10/15/2021] multivitamin with minerals tablet 1 tablet  1 tablet Oral Daily 12/15/2021, MD  Oral care mouth rinse  15 mL Mouth Rinse PRN Ezequiel Essex, NP       pantoprazole (PROTONIX) EC tablet 40 mg  40 mg Oral Daily Rust-Chester, Britton L, NP   40 mg at 10/14/21 0957   polyethylene glycol (MIRALAX / GLYCOLAX) packet 17 g  17 g Oral Daily PRN Rust-Chester, Britton L, NP       sodium bicarbonate 150 mEq in dextrose 5 % 1,150 mL infusion   Intravenous Continuous Ezequiel Essex, NP 100 mL/hr at 10/14/21 1549 Infusion Verify at 10/14/21 1549   zinc oxide 20 % ointment   Topical TID Synthia Innocent, [facility]   Given at 10/14/21 1502     Discharge Medications: Please see discharge summary for a list of discharge medications.  Relevant Imaging Results:  Relevant Lab  Results:   Additional Information SS# [id number]  Allayne Butcher, RN

## 2021-10-15 ENCOUNTER — Encounter: Payer: Self-pay | Admitting: Internal Medicine

## 2021-10-15 DIAGNOSIS — L89159 Pressure ulcer of sacral region, unspecified stage: Secondary | ICD-10-CM | POA: Diagnosis not present

## 2021-10-15 DIAGNOSIS — R6521 Severe sepsis with septic shock: Secondary | ICD-10-CM | POA: Diagnosis not present

## 2021-10-15 DIAGNOSIS — N179 Acute kidney failure, unspecified: Secondary | ICD-10-CM | POA: Diagnosis not present

## 2021-10-15 DIAGNOSIS — A419 Sepsis, unspecified organism: Secondary | ICD-10-CM | POA: Diagnosis not present

## 2021-10-15 LAB — THYROID PANEL WITH TSH
Free Thyroxine Index: 2.2 (ref 1.2–4.9)
T3 Uptake Ratio: 31 % (ref 24–39)
T4, Total: 7 ug/dL (ref 4.5–12.0)
TSH: 0.564 u[IU]/mL (ref 0.450–4.500)

## 2021-10-15 LAB — BASIC METABOLIC PANEL
Anion gap: 9 (ref 5–15)
Anion gap: 9 (ref 5–15)
BUN: 68 mg/dL — ABNORMAL HIGH (ref 6–20)
BUN: 75 mg/dL — ABNORMAL HIGH (ref 6–20)
CO2: 22 mmol/L (ref 22–32)
CO2: 23 mmol/L (ref 22–32)
Calcium: 8.1 mg/dL — ABNORMAL LOW (ref 8.9–10.3)
Calcium: 8.2 mg/dL — ABNORMAL LOW (ref 8.9–10.3)
Chloride: 110 mmol/L (ref 98–111)
Chloride: 111 mmol/L (ref 98–111)
Creatinine, Ser: 3.16 mg/dL — ABNORMAL HIGH (ref 0.44–1.00)
Creatinine, Ser: 3.78 mg/dL — ABNORMAL HIGH (ref 0.44–1.00)
GFR, Estimated: 13 mL/min — ABNORMAL LOW (ref >60)
GFR, Estimated: 16 mL/min — ABNORMAL LOW (ref >60)
Glucose, Bld: 128 mg/dL — ABNORMAL HIGH (ref 70–99)
Glucose, Bld: 88 mg/dL (ref 70–99)
Potassium: 3.8 mmol/L (ref 3.5–5.1)
Potassium: 3.8 mmol/L (ref 3.5–5.1)
Sodium: 142 mmol/L (ref 135–145)
Sodium: 142 mmol/L (ref 135–145)

## 2021-10-15 LAB — HEPATIC FUNCTION PANEL
ALT: 50 U/L — ABNORMAL HIGH (ref 0–44)
AST: 85 U/L — ABNORMAL HIGH (ref 15–41)
Albumin: 2.2 g/dL — ABNORMAL LOW (ref 3.5–5.0)
Alkaline Phosphatase: 69 U/L (ref 38–126)
Bilirubin, Direct: <0.1 mg/dL (ref 0.0–0.2)
Total Bilirubin: 0.7 mg/dL (ref 0.3–1.2)
Total Protein: 5.6 g/dL — ABNORMAL LOW (ref 6.5–8.1)

## 2021-10-15 LAB — BLOOD GAS, VENOUS
Acid-Base Excess: 0.3 mmol/L (ref 0.0–2.0)
Bicarbonate: 23.3 mmol/L (ref 20.0–28.0)
O2 Saturation: 97 %
Patient temperature: 37
pCO2, Ven: 32 mmHg — ABNORMAL LOW (ref 44–60)
pH, Ven: 7.47 — ABNORMAL HIGH (ref 7.25–7.43)
pO2, Ven: 69 mmHg — ABNORMAL HIGH (ref 32–45)

## 2021-10-15 LAB — GLUCOSE, CAPILLARY
Glucose-Capillary: 101 mg/dL — ABNORMAL HIGH (ref 70–99)
Glucose-Capillary: 102 mg/dL — ABNORMAL HIGH (ref 70–99)
Glucose-Capillary: 104 mg/dL — ABNORMAL HIGH (ref 70–99)
Glucose-Capillary: 125 mg/dL — ABNORMAL HIGH (ref 70–99)
Glucose-Capillary: 214 mg/dL — ABNORMAL HIGH (ref 70–99)
Glucose-Capillary: 95 mg/dL (ref 70–99)
Glucose-Capillary: 96 mg/dL (ref 70–99)

## 2021-10-15 LAB — CBC
HCT: 26.4 % — ABNORMAL LOW (ref 36.0–46.0)
Hemoglobin: 9.1 g/dL — ABNORMAL LOW (ref 12.0–15.0)
MCH: 27.7 pg (ref 26.0–34.0)
MCHC: 34.5 g/dL (ref 30.0–36.0)
MCV: 80.2 fL (ref 80.0–100.0)
Platelets: 262 10*3/uL (ref 150–400)
RBC: 3.29 MIL/uL — ABNORMAL LOW (ref 3.87–5.11)
RDW: 13.3 % (ref 11.5–15.5)
WBC: 12.2 10*3/uL — ABNORMAL HIGH (ref 4.0–10.5)
nRBC: 0 % (ref 0.0–0.2)

## 2021-10-15 LAB — CK: Total CK: 3369 U/L — ABNORMAL HIGH (ref 38–234)

## 2021-10-15 LAB — MAGNESIUM: Magnesium: 2.3 mg/dL (ref 1.7–2.4)

## 2021-10-15 LAB — PROCALCITONIN: Procalcitonin: 0.69 ng/mL

## 2021-10-15 LAB — PHOSPHORUS: Phosphorus: 3.1 mg/dL (ref 2.5–4.6)

## 2021-10-15 MED ORDER — SODIUM CHLORIDE 0.9 % IV SOLN: INTRAVENOUS | Status: DC

## 2021-10-15 MED ORDER — GABAPENTIN 100 MG PO CAPS
100.0000 mg | ORAL_CAPSULE | Freq: Two times a day (BID) | ORAL | Status: DC
  Administered 2021-10-15 – 2021-10-18 (×8): 100 mg via ORAL
  Filled 2021-10-15 (×8): qty 1

## 2021-10-15 NOTE — Progress Notes (Signed)
Nutrition Follow Up Note   DOCUMENTATION CODES:   Morbid obesity  INTERVENTION:   Ensure Enlive po TID, each supplement provides 350 kcal and 20 grams of protein.  MVI po daily  Vitamin C $RemoveB'500mg'nVRQMmnb$  po BID  Pt at high refeed risk; recommend monitor potassium, magnesium and phosphorus labs daily until stable  Vitamin labs B1, D, A, E, zinc, B6 and copper pending.   NUTRITION DIAGNOSIS:   Increased nutrient needs related to wound healing as evidenced by estimated needs.  GOAL:   Patient will meet greater than or equal to 90% of their needs -not met   MONITOR:   PO intake, Supplement acceptance, Labs, Weight trends, Skin, I & O's  ASSESSMENT:   56 y/o female with h/o HTN, CKD III, neuropathy and falls who is admitted with falls, rhabdomyolysis and AKI.  Met with pt in room today. Pt reports decreased appetite and oral intake for several months pta. Pt reports that she has not been drinking any supplements at home but that she does have a vitamin that she takes weekly although she can't remember what it is. Pt's breakfast is sitting on her side table with only bites taken from it. RD discussed with pt the importance of adequate nutrition needed to preserve lean muscle. Pt is willing to drink chocolate or vanilla Ensure in hospital. Pt reports recent weakness, tingling and numbness in her feet and legs pta. Vitamin labs are pending. Pt denies any history of abdominal surgery or bariatric surgery. Recommend continue supplements and MVI. RD will supplement vitamins as needed. Pt remains at high refeed risk. Pt reports her UBW is ~235lbs.   Medications reviewed and include: vitamin C, aspirin, heparin, MVI, protonix, NaCl $RemoveBefore'@75ml'zftTgMJwWvAlj$ /hr   Labs reviewed: K 3.8 wnl, BUN 68(H), creat 3.16(H), P 3.1 wnl, Mg 2.3 wnl Iron 47, TIBC 147(L), ferritin 354, folate 7.6, B12 1636(H)- 9/11 Wbc- 12.2(H), Hgb 9.1(L), Hct 26.4(L) Cbgs- 104, 101, 102, 214 x 24 hrs  NUTRITION - FOCUSED PHYSICAL  EXAM:  Flowsheet Row Most Recent Value  Orbital Region No depletion  Upper Arm Region No depletion  Thoracic and Lumbar Region No depletion  Buccal Region No depletion  Temple Region No depletion  Clavicle Bone Region No depletion  Clavicle and Acromion Bone Region No depletion  Scapular Bone Region No depletion  Dorsal Hand No depletion  Patellar Region No depletion  Anterior Thigh Region No depletion  Posterior Calf Region No depletion  Edema (RD Assessment) None  Hair Reviewed  Eyes Reviewed  Mouth Reviewed  Skin Reviewed  Nails Reviewed   Diet Order:   Diet Order             Diet regular Room service appropriate? Yes; Fluid consistency: Thin  Diet effective now                  EDUCATION NEEDS:   Education needs have been addressed  Skin:  Skin Assessment: Reviewed RN Assessment (bilateral buttocks  11 cm x 8 cm, right upper, posterior thigh MASD-IAD area measures 10 cm x 13 cm, left upper, posterior thigh MASD-IAD area measures 12 cm x 10 cm.)  Last BM:  9/11- type 5  Height:   Ht Readings from Last 1 Encounters:  10/14/21 $RemoveB'5\' 3"'RvgJCuoO$  (1.6 m)    Weight:   Wt Readings from Last 1 Encounters:  10/15/21 106.8 kg    Ideal Body Weight:  52.2 kg  BMI:  Body mass index is 41.71 kg/m.  Estimated Nutritional Needs:  Kcal:  2000-2300kcal/day  Protein:  100-115g/day  Fluid:  1.6-1.8L/day  Koleen Distance MS, RD, LDN Please refer to [facility] for RD and/or RD on-call/weekend/after hours pager

## 2021-10-15 NOTE — Consult Note (Signed)
PHARMACY CONSULT NOTE  Pharmacy Consult for Electrolyte Monitoring and Replacement   Recent Labs: Potassium (mmol/L)  Date Value  10/14/2021 3.8   Magnesium (mg/dL)  Date Value  29/56/2130 2.3   Calcium (mg/dL)  Date Value  86/57/8469 8.2 (L)   Albumin (g/dL)  Date Value  62/95/2841 2.2 (L)   Phosphorus (mg/dL)  Date Value  32/44/0102 3.1   Sodium (mmol/L)  Date Value  10/14/2021 142   Assessment: Patient is a 56 y/o F with medical history including CKD, HTN, neuropathy who is admitted with acute renal failure, rhabdomyolysis, and hypotension. Patient is currently admitted to the ICU. Pharmacy consulted to assist with electrolyte monitoring and replacement as indicated.  MIVF: Acidosis improved after sodium bicarb. Now on 9% NaCl at 75 mL/hr.   Goal of Therapy:  Electrolytes within normal limits  Plan:  --No replacement warranted  --Follow-up electrolytes with AM labs tomorrow   Elliot Gurney, PharmD Clinical Pharmacist  10/15/2021 8:04 AM

## 2021-10-15 NOTE — Progress Notes (Signed)
[facility]  ROUNDING NOTE   Subjective:   Patient seen and evaluated at bedside in ICU Resting comfortably, alert and oriented Denies pain or discomfort Tolerating meals without nausea and vomiting Remains on room air Mild lower extremity edema remains  Objective:  Vital signs in last 24 hours:  Temp:  [97.8 F (36.6 C)-98.3 F (36.8 C)] 98.1 F (36.7 C) (09/12 0800) Pulse Rate:  [63-113] 63 (09/12 0900) Resp:  [16-26] 18 (09/12 0900) BP: (84-128)/(33-115) 105/46 (09/12 0900) SpO2:  [96 %-100 %] 96 % (09/12 0900) Weight:  [106.8 kg] 106.8 kg (09/12 0445)  Weight change: 16.4 kg Filed Weights   10/14/21 0110 10/14/21 0800 10/15/21 0445  Weight: 90.7 kg 107.1 kg 106.8 kg    Intake/Output: I/O last 3 completed shifts: In: 5536.5 [I.V.:1489.7; IV Piggyback:4046.8] Out: 2426 [Urine:2425; Stool:1]   Intake/Output this shift:  Total I/O In: 194.2 [P.O.:120; I.V.:74.2] Out: -   Physical Exam: General: NAD  Head: Normocephalic, atraumatic. Moist oral mucosal membranes  Eyes: Anicteric  Lungs:  Clear to auscultation, normal effort, room air  Heart: Regular rate and rhythm  Abdomen:  Soft, nontender, nondistended  Extremities: 1+ peripheral edema.  Neurologic: Nonfocal, moving all four extremities  Skin: No lesions  Access: None    Basic Metabolic Panel: Recent Labs  Lab 10/14/21 0236 10/14/21 0943 10/14/21 1516 10/14/21 2352 10/15/21 0407 10/15/21 0417  NA 141 141 143 142 142  --   K 5.6* 5.2* 4.3 3.8 3.8  --   CL 112* 112* 111 110 111  --   CO2 13* 15* 19* 23 22  --   GLUCOSE 83 79 101* 128* 88  --   BUN 88* 96* 91* 75* 68*  --   CREATININE 7.54* 6.26* 5.13* 3.78* 3.16*  --   CALCIUM 8.8* 8.9 8.5* 8.2* 8.1*  --   MG  --   --   --   --   --  2.3  PHOS  --   --   --   --   --  3.1    Liver Function Tests: Recent Labs  Lab 10/14/21 0236 10/15/21 0417  AST 84* 85*  ALT 46* 50*  ALKPHOS 91 69  BILITOT 0.9 0.7  PROT 6.5 5.6*  ALBUMIN  2.9* 2.2*   Recent Labs  Lab 10/14/21 0236  LIPASE 38   No results for input(s): "AMMONIA" in the last 168 hours.  CBC: Recent Labs  Lab 10/14/21 0236 10/15/21 0417  WBC 19.1* 12.2*  NEUTROABS 16.5*  --   HGB 10.6* 9.1*  HCT 33.1* 26.4*  MCV 84.0 80.2  PLT 303 262    Cardiac Enzymes: Recent Labs  Lab 10/14/21 0236 10/15/21 0417  CKTOTAL 3,746* 3,369*    BNP: Invalid input(s): "POCBNP"  CBG: Recent Labs  Lab 10/14/21 1619 10/14/21 1950 10/15/21 0005 10/15/21 0405 10/15/21 0748  GLUCAP 85 91 214* 102* 101*    Microbiology: Results for orders placed or performed during the hospital encounter of 10/14/21  Blood Culture (routine x 2)     Status: None (Preliminary result)   Collection Time: 10/14/21  1:38 AM   Specimen: BLOOD  Result Value Ref Range Status   Specimen Description BLOOD RIGHT HAND  Final   Special Requests   Final    BOTTLES DRAWN AEROBIC AND ANAEROBIC Blood Culture results may not be optimal due to an inadequate volume of blood received in culture bottles   Culture   Final    NO GROWTH  1 DAY Performed at [facility], [address] [place], [place] [address]    Report Status PENDING  Incomplete  SARS Coronavirus 2 by RT PCR (hospital order, performed in [facility] hospital lab) *cepheid single result test* Anterior Nasal Swab     Status: None   Collection Time: 10/14/21  1:38 AM   Specimen: Anterior Nasal Swab  Result Value Ref Range Status   SARS Coronavirus 2 by RT PCR NEGATIVE NEGATIVE Final    Comment: (NOTE) SARS-CoV-2 target nucleic acids are NOT DETECTED.  The SARS-CoV-2 RNA is generally detectable in upper and lower respiratory specimens during the acute phase of infection. The lowest concentration of SARS-CoV-2 viral copies this assay can detect is 250 copies / mL. A negative result does not preclude SARS-CoV-2 infection and should not be used as the sole basis for treatment or other patient management decisions.   A negative result may occur with improper specimen collection / handling, submission of specimen other than nasopharyngeal swab, presence of viral mutation(s) within the areas targeted by this assay, and inadequate number of viral copies (<250 copies / mL). A negative result must be combined with clinical observations, patient history, and epidemiological information.  Fact Sheet for Patients:   https://www.patel.info/  Fact Sheet for Healthcare Providers: https://hall.com/  This test is not yet approved or  cleared by the [place] FDA and has been authorized for detection and/or diagnosis of SARS-CoV-2 by FDA under an Emergency Use Authorization (EUA).  This EUA will remain in effect (meaning this test can be used) for the duration of the COVID-19 declaration under Section 564(b)(1) of the Act, 21 U.S.C. section 360bbb-3(b)(1), unless the authorization is terminated or revoked sooner.  Performed at [facility], [address] [place], [place] [address]   Blood Culture (routine x 2)     Status: None (Preliminary result)   Collection Time: 10/14/21  2:15 AM   Specimen: BLOOD  Result Value Ref Range Status   Specimen Description BLOOD LEFT HAND  Final   Special Requests   Final    BOTTLES DRAWN AEROBIC AND ANAEROBIC Blood Culture results may not be optimal due to an inadequate volume of blood received in culture bottles   Culture   Final    NO GROWTH 1 DAY Performed at [facility], [address], [place], [place] [address]    Report Status PENDING  Incomplete  MRSA Next Gen by PCR, Nasal     Status: None   Collection Time: 10/14/21  8:13 AM   Specimen: Nasal Mucosa; Nasal Swab  Result Value Ref Range Status   MRSA by PCR Next Gen NOT DETECTED NOT DETECTED Final    Comment: (NOTE) The GeneXpert MRSA Assay (FDA approved for NASAL specimens only), is one component of a comprehensive MRSA colonization  surveillance program. It is not intended to diagnose MRSA infection nor to guide or monitor treatment for MRSA infections. Test performance is not FDA approved in patients less than 49 years old. Performed at [facility], [address] [place], [place] [address]     Coagulation Studies: Recent Labs    10/14/21 0236  LABPROT 16.9*  INR 1.4*    Urinalysis: Recent Labs    10/14/21 0138  COLORURINE AMBER*  LABSPEC 1.023  PHURINE 5.0  GLUCOSEU NEGATIVE  HGBUR MODERATE*  BILIRUBINUR NEGATIVE  KETONESUR NEGATIVE  PROTEINUR 30*  NITRITE NEGATIVE  LEUKOCYTESUR NEGATIVE      Imaging: ECHOCARDIOGRAM COMPLETE  Result Date: 10/14/2021  ECHOCARDIOGRAM REPORT   Patient Name:   Karen Ray Date of Exam: 10/14/2021 Medical Rec #:  [id number]        Height:       63.0 in Accession #:    [id number]       Weight:       236.1 lb Date of Birth:  1965/04/24        BSA:          2.075 m Patient Age:    56 years         BP:           108/59 mmHg Patient Gender: F                HR:           105 bpm. Exam Location:  [facility] Procedure: 2D Echo, Cardiac Doppler and Color Doppler Indications:     Abnormal ECG R94.31  History:         Patient has no prior history of Echocardiogram examinations.                  Risk Factors:Hypertension.  Sonographer:     Cristela Blue Referring Phys:  [id number] Ezequiel Essex Diagnosing Phys: Lorine Bears MD  Sonographer Comments: Suboptimal apical window and no subcostal window. IMPRESSIONS  1. Left ventricular ejection fraction, by estimation, is 55 to 60%. The left ventricle has normal function. The left ventricle has no regional wall motion abnormalities. Left ventricular diastolic parameters are consistent with Grade I diastolic dysfunction (impaired relaxation).  2. Right ventricular systolic function is normal. The right ventricular size is normal. Tricuspid regurgitation signal is inadequate for assessing PA pressure.  3. The mitral valve is normal in  structure. No evidence of mitral valve regurgitation. No evidence of mitral stenosis.  4. The aortic valve is normal in structure. Aortic valve regurgitation is not visualized. No aortic stenosis is present. FINDINGS  Left Ventricle: Left ventricular ejection fraction, by estimation, is 55 to 60%. The left ventricle has normal function. The left ventricle has no regional wall motion abnormalities. The left ventricular internal cavity size was normal in size. There is  no left ventricular hypertrophy. Left ventricular diastolic parameters are consistent with Grade I diastolic dysfunction (impaired relaxation). Right Ventricle: The right ventricular size is normal. No increase in right ventricular wall thickness. Right ventricular systolic function is normal. Tricuspid regurgitation signal is inadequate for assessing PA pressure. Left Atrium: Left atrial size was normal in size. Right Atrium: Right atrial size was normal in size. Pericardium: There is no evidence of pericardial effusion. Mitral Valve: The mitral valve is normal in structure. No evidence of mitral valve regurgitation. No evidence of mitral valve stenosis. Tricuspid Valve: The tricuspid valve is normal in structure. Tricuspid valve regurgitation is not demonstrated. No evidence of tricuspid stenosis. Aortic Valve: The aortic valve is normal in structure. Aortic valve regurgitation is not visualized. No aortic stenosis is present. Aortic valve mean gradient measures 5.5 mmHg. Aortic valve peak gradient measures 11.5 mmHg. Aortic valve area, by VTI measures 2.82 cm. Pulmonic Valve: The pulmonic valve was normal in structure. Pulmonic valve regurgitation is not visualized. No evidence of pulmonic stenosis. Aorta: The aortic root is normal in size and structure. Venous: The inferior vena cava was not well visualized. IAS/Shunts: No atrial level shunt detected by color flow Doppler.  LEFT VENTRICLE PLAX 2D LVIDd:         4.00 cm   Diastology LVIDs:  2.90 cm   LV e' medial:    6.42 cm/s LV PW:         1.10 cm   LV E/e' medial:  10.1 LV IVS:        0.80 cm   LV e' lateral:   5.66 cm/s LVOT diam:     2.00 cm   LV E/e' lateral: 11.4 LV SV:         67 LV SV Index:   32 LVOT Area:     3.14 cm  RIGHT VENTRICLE RV S prime:     19.10 cm/s TAPSE (M-mode): 2.9 cm LEFT ATRIUM             Index        RIGHT ATRIUM           Index LA diam:        2.80 cm 1.35 cm/m   RA Area:     16.90 cm LA Vol (A2C):   45.6 ml 21.98 ml/m  RA Volume:   44.30 ml  21.35 ml/m LA Vol (A4C):   51.9 ml 25.01 ml/m LA Biplane Vol: 48.9 ml 23.57 ml/m  AORTIC VALVE AV Area (Vmax):    2.30 cm AV Area (Vmean):   2.66 cm AV Area (VTI):     2.82 cm AV Vmax:           169.50 cm/s AV Vmean:          105.450 cm/s AV VTI:            0.237 m AV Peak Grad:      11.5 mmHg AV Mean Grad:      5.5 mmHg LVOT Vmax:         124.00 cm/s LVOT Vmean:        89.400 cm/s LVOT VTI:          0.213 m LVOT/AV VTI ratio: 0.90  AORTA Ao Root diam: 2.50 cm MITRAL VALVE               TRICUSPID VALVE MV Area (PHT): 5.58 cm    TR Peak grad:   11.6 mmHg MV Decel Time: 136 msec    TR Vmax:        170.00 cm/s MV E velocity: 64.70 cm/s MV A velocity: 81.40 cm/s  SHUNTS MV E/A ratio:  0.79        Systemic VTI:  0.21 m                            Systemic Diam: 2.00 cm Kathlyn Sacramento MD Electronically signed by Kathlyn Sacramento MD Signature Date/Time: 10/14/2021/2:10:53 PM    Final    US Venous Img Lower Bilateral (DVT)  Result Date: 10/14/2021 CLINICAL DATA:  Lower extremity edema. EXAM: BILATERAL LOWER EXTREMITY VENOUS DOPPLER ULTRASOUND TECHNIQUE: Gray-scale sonography with graded compression, as well as color Doppler and duplex ultrasound were performed to evaluate the lower extremity deep venous systems from the level of the common femoral vein and including the common femoral, femoral, profunda femoral, popliteal and calf veins including the posterior tibial, peroneal and gastrocnemius veins when visible. The superficial  great saphenous vein was also interrogated. Spectral Doppler was utilized to evaluate flow at rest and with distal augmentation maneuvers in the common femoral, femoral and popliteal veins. COMPARISON:  CT CAP, 10/14/2021. FINDINGS: Suboptimal evaluation, with poor acoustic penetration secondary to patient habitus. RIGHT LOWER EXTREMITY VENOUS Normal compressibility of the RIGHT  common femoral, superficial femoral, and popliteal veins, as well as the visualized calf veins. Visualized portions of profunda femoral vein and great saphenous vein unremarkable. No filling defects to suggest DVT on grayscale or color Doppler imaging. Doppler waveforms show normal direction of venous flow, normal respiratory plasticity and response to augmentation. OTHER No evidence of superficial thrombophlebitis or abnormal fluid collection. Limitations: Patient body habitus LEFT LOWER EXTREMITY VENOUS Normal compressibility of the LEFT common femoral, superficial femoral, and popliteal veins, as well as the visualized calf veins. Visualized portions of profunda femoral vein and great saphenous vein unremarkable. No filling defects to suggest DVT on grayscale or color Doppler imaging. Doppler waveforms show normal direction of venous flow, normal respiratory plasticity and response to augmentation. OTHER No evidence of superficial thrombophlebitis or abnormal fluid collection. Limitations: Patient body habitus IMPRESSION: Suboptimal evaluation, within these constraints; No evidence of femoropopliteal DVT or superficial thrombophlebitis within either lower extremity. Michaelle Birks, MD [facility] Radiology Electronically Signed   By: Michaelle Birks M.D.   On: 10/14/2021 11:51   CT HEAD WO CONTRAST (5MM)  Result Date: 10/14/2021 CLINICAL DATA:  Mental status change.  Fall.  A EXAM: CT HEAD WITHOUT CONTRAST TECHNIQUE: Contiguous axial images were obtained from the base of the skull through the  vertex without intravenous contrast. RADIATION DOSE REDUCTION: This exam was performed according to the departmental dose-optimization program which includes automated exposure control, adjustment of the mA and/or kV according to patient size and/or use of iterative reconstruction technique. COMPARISON:  MRI 03/25/2019 FINDINGS: Brain: Generalized atrophic changes, advanced for age. No focal abnormality affecting the brainstem or cerebellum. Moderate to marked chronic small-vessel ischemic changes of the cerebral hemispheric white matter. No sign of acute infarction. No mass, hemorrhage, hydrocephalus or extra-axial collection Vascular: There is atherosclerotic calcification of the major vessels at the base of the brain. Skull: Negative Sinuses/Orbits: Clear/normal Other: None IMPRESSION: No acute or traumatic finding. Age advanced brain volume loss. Moderate to marked chronic small-vessel ischemic changes of the cerebral hemispheric white matter. Electronically Signed   By: Nelson Chimes M.D.   On: 10/14/2021 07:50   CT CHEST ABDOMEN PELVIS WO CONTRAST  Result Date: 10/14/2021 CLINICAL DATA:  Sepsis EXAM: CT CHEST, ABDOMEN AND PELVIS WITHOUT CONTRAST TECHNIQUE: Multidetector CT imaging of the chest, abdomen and pelvis was performed following the standard protocol without IV contrast. RADIATION DOSE REDUCTION: This exam was performed according to the departmental dose-optimization program which includes automated exposure control, adjustment of the mA and/or kV according to patient size and/or use of iterative reconstruction technique. COMPARISON:  03/04/2007 chest CT FINDINGS: CT CHEST FINDINGS Cardiovascular: No significant vascular findings. Normal heart size. No pericardial effusion. Mediastinum/Nodes: Negative for adenopathy or mass. Mild dependent atelectasis Lungs/Pleura: There is no edema, consolidation, effusion, or pneumothorax. Musculoskeletal: No acute finding CT ABDOMEN PELVIS FINDINGS Hepatobiliary:  No focal liver abnormality.No evidence of biliary obstruction or stone. Pancreas: Unremarkable. Spleen: Unremarkable. Adrenals/Urinary Tract: Negative adrenals. No hydronephrosis or stone. 2 cm cystic density arising from the right kidney. Unremarkable bladder. Stomach/Bowel: No obstruction. No appendicitis. Rectal distant structure at stool distended rectum. No generalized stool retention. Vascular/Lymphatic: No acute vascular abnormality. No mass or adenopathy. Reproductive:No pathologic findings. Other: No ascites or pneumoperitoneum. Musculoskeletal: No acute abnormalities. Degenerative facet spurring at the lower lumbar levels. IMPRESSION: No acute finding or explanation for sepsis. Electronically Signed   By: Jorje Guild M.D.   On: 10/14/2021 05:28   DG Chest [facility] 1 View  Result  Date: 10/14/2021 CLINICAL DATA:  Multiple falls; questionable sepsis EXAM: PORTABLE CHEST 1 VIEW COMPARISON:  Radiographs 02/26/2007 FINDINGS: No focal consolidation, pleural effusion, or pneumothorax. Normal cardiomediastinal silhouette. No acute osseous abnormality. No displaced rib fractures. IMPRESSION: No acute abnormality. Electronically Signed   By: Minerva Fester M.D.   On: 10/14/2021 01:52     Medications:    sodium chloride     sodium chloride 75 mL/hr at 10/15/21 0900   norepinephrine (LEVOPHED) Adult infusion      vitamin C  500 mg Oral BID   aspirin EC  81 mg Oral Daily   Chlorhexidine Gluconate Cloth  6 each Topical Daily   feeding supplement  237 mL Oral TID BM   gabapentin  100 mg Oral BID   heparin  5,000 Units Subcutaneous Q8H   leptospermum manuka honey  1 Application Topical Daily   midodrine  10 mg Oral TID WC   multivitamin with minerals  1 tablet Oral Daily   pantoprazole  40 mg Oral Daily   zinc oxide   Topical TID   acetaminophen, docusate sodium, mouth rinse, polyethylene glycol  Assessment/ Plan:  Karen Ray is a 56 y.o.  female with past medical conditions including  hypertension, sensorimotor nephropathy, and chronic kidney disease stage IIIa, who was admitted to [facility] on 10/14/2021 for Rhabdomyolysis [M62.82] Septic shock (HCC) [A41.9, R65.21] Acute renal failure, unspecified acute renal failure type (HCC) [N17.9] Pressure injury of skin of sacral region, unspecified injury stage [L89.159].   Acute kidney injury with hyperkalemia on chronic kidney disease stage IIIa.  Baseline creatinine appears to be 2.3 from 10/03/2020.  Appears patient is followed by [facility] nephrology.  CT abdomen pelvis negative for obstruction however patient has 2 cm cyst in right kidney.Acute kidney injury likely secondary to hypovolemia with a component of Rhabdomylitis. Potassium on admission 5.6.    Renal function continues to improve with adequate urine output, 2.4 L in preceding 24 hours.  Agree with IV fluids for at least another 24 hours.  Continue to avoid nephrotoxic agents and therapies.  Potassium improved, 3.8 today.  Lab Results  Component Value Date   CREATININE 3.16 (H) 10/15/2021   CREATININE 3.78 (H) 10/14/2021   CREATININE 5.13 (H) 10/14/2021    Intake/Output Summary (Last 24 hours) at 10/15/2021 1050 Last data filed at 10/15/2021 0900 Gross per 24 hour  Intake 2184.04 ml  Output 2426 ml  Net -241.96 ml   2.  Hypertension with chronic kidney disease.  Home regimen includes amlodipine, chlorthalidone, and lisinopril.  These medications currently held.  Currently receiving midodrine 10 mg 3 times daily, blood pressure 105/46   3.  Acute metabolic acidosis.  Serum bicarb on admission 13.  Corrected with sodium bicarb infusion.    LOS: 1 Bora Broner 9/12/202310:50 AM

## 2021-10-15 NOTE — Progress Notes (Signed)
NAME:  Karen Ray, MRN:  [id number], DOB:  August 15, 1965, LOS: 1 ADMISSION DATE:  10/14/2021, CONSULTATION DATE:  10/14/21 REFERRING MD:  Dr. Karma Greaser, CHIEF COMPLAINT:  Fall   History of Present Illness:  56 yo F presenting to [facility] ED on 10/14/21 via EMS after a mechanical fall at home.  The patient has been "sliding out" of her chair, landing on the floor. They recommended she come to the hospital to be evaluated, but the patient refused. Later on the same day, she was leaning over to charge her hover round and the chair flipped over, EMS arrived and assisted the patient back into the chair. She landed between hover round and the wall. She denies any recent sick symptoms including: new productive cough, urinary symptoms, nausea/vomiting/diarrhea.  She has difficulty taking care of ADL's and only has an aide come to buy groceries. She had old dried stool upon arrival to ED. She reports poor PO intake, stating the last thing she ate was bologna on Thursday and she has a few glasses of water/soda daily.  ED course: Work up for Sepsis per protocol, pan CT imaging unremarkable. Lab work revealed Rhabdomyolysis & acute renal failure with mild Transaminitis. Medications given: 3 L LR bolus, cefepime & vancomycin Initial Vitals: 97.6, tachypneic 24, 96, 108/52 & SpO2 100% on RA Significant labs: (Labs/ Imaging personally reviewed) Chemistry: Na+:141, K+: 5.6, BUN/Cr.: 88/ 7.54, Serum CO2/ AG: 13/ 16, AST/ALT: 84/ 46, albumin: 2.9 Hematology: WBC: 19.1, Hgb: 10.6,  Troponin: 12 > 7, BNP: 8.6, Lactic/ PCT: 2.3 > 1.2/ 1.39,  COVID-19: negative  CXR 10/14/21: no acute abnormality CT chest/abdomen/pelvis wo contrast 10/14/21: 2 cm cystic density arising from the right kidney.  PCCM consulted for admission due to labile hemodynamics and hypotension post IVF resuscitation with potential need for vasopressor support as well as acute renal failure s/t rhabdomyolysis with potential need for  dialysis.  Pertinent  Medical History  CKD Stage 3a HTN Sensorimotor neuropathy  Significant Hospital Events: Including procedures, antibiotic start and stop dates in addition to other pertinent events   09/11: Pt admitted following falls with acute on chronic renal failure with anion gap metabolic acidosis, rhabdomyolysis, and hypotension  09/12: No acute events overnight pt remained off vasopressors and sodium bicarb gtt discontinued   Interim History / Subjective:  As outlined above   Objective   Blood pressure (!) 94/43, pulse 76, temperature 97.8 F (36.6 C), temperature source Oral, resp. rate 18, height 5' 3" (1.6 m), weight 106.8 kg, SpO2 98 %.        Intake/Output Summary (Last 24 hours) at 10/15/2021 0735 Last data filed at 10/15/2021 0600 Gross per 24 hour  Intake 1989.83 ml  Output 2426 ml  Net -436.17 ml   Filed Weights   10/14/21 0110 10/14/21 0800 10/15/21 0445  Weight: 90.7 kg 107.1 kg 106.8 kg    Examination: General: Adult female, critically ill, lying in bed, NAD HEENT: MM pink/moist, anicteric, atraumatic, neck supple Neuro: A&O x 4, able to follow commands, PERRL +3, MAE CV: s1s2 RRR, NSR on monitor, no r/m/g Pulm: Regular, non labored on RA , breath sounds clear-BUL & clear/diminished-BLL GI: Soft, rounded, non tender, bs x 4 Skin: See below Unstageable sacral injury    Posterior bilateral buttocks/thighs excoriation  Extremities: warm/dry, pulses + 2 R/P, +1 edema noted BLE  Resolved Hospital Problem list   AGMA Hyperkalemia  Assessment & Plan:  Rhabdomyolysis Acute Renal Failure superimposed on CKD Stage 3a secondary to rhabdomyolysis  Baseline Cr: 1.4 (most recent from nephro note 09/2020), Cr on admission: 7.54. CK: 3746. Patient received 3 L LR bolus - Strict I/O's: alert provider if UOP < 0.5 mL/kg/hr - Daily BMP, replace electrolytes PRN - Avoid nephrotoxic agents as able, ensure adequate renal perfusion - Nephrology consulted,  appreciate input: no indication for dialysis at this time  - CK remains elevated will continue iv fluid resuscitation   Hypotension likely secondary to hypovolemia and anion gap metabolic acidosis  - Continuous telemetry monitoring  - IV fluid resuscitation and/or vasopressor therapy to maintain map >65 - Continue midodrine  - Hold outpatient antihypertensives   Leukocytosis Lactic Acidosis suspect s/t Rhabdomyolysis - Lactic has normalized 2.3 > 1.2, PCT trending down as renal function improves and no signs of infection no indication for abx at this time   Transaminitis suspect secondary to hypotension   CT Abd Pelvis 10/14/21 revealed no focal liver abnormality or evidence of biliary obstruction or stone  - Trend hepatic function - Avoid hepatotoxic agents - Will check acute hepatitis panel   Falls  Polyneuropathy   CT Head 10/14/21 negative for acute abnormality  - PT/OT once bp stable  - Continue to follow-up with outpatient neurology  - Continue gabapentin   Unstageable pressure ulceration  Posterior thigh/buttocks excoriation - Turn q2hrs  - Wound care consulted appreciate input    Best Practice (right click and "Reselect all SmartList Selections" daily)  Diet/type: Regular Diet  DVT prophylaxis: Subq heparin  GI prophylaxis: PPI Lines: N/A Foley:  N/A Code Status:  full code Last date of multidisciplinary goals of care discussion [10/15/21]  Updated pts son Hetvi Shawhan via telephone regarding current plan of care and pts condition all questions were answered Labs   CBC: Recent Labs  Lab 10/14/21 0236 10/15/21 0417  WBC 19.1* 12.2*  NEUTROABS 16.5*  --   HGB 10.6* 9.1*  HCT 33.1* 26.4*  MCV 84.0 80.2  PLT 303 947    Basic Metabolic Panel: Recent Labs  Lab 10/14/21 0236 10/14/21 0943 10/14/21 1516 10/14/21 2352 10/15/21 0417  NA 141 141 143 142  --   K 5.6* 5.2* 4.3 3.8  --   CL 112* 112* 111 110  --   CO2 13* 15* 19* 23  --   GLUCOSE 83 79 101*  128*  --   BUN 88* 96* 91* 75*  --   CREATININE 7.54* 6.26* 5.13* 3.78*  --   CALCIUM 8.8* 8.9 8.5* 8.2*  --   MG  --   --   --   --  2.3  PHOS  --   --   --   --  3.1   GFR: Estimated Creatinine Clearance: 19 mL/min (A) (by C-G formula based on SCr of 3.78 mg/dL (H)). Recent Labs  Lab 10/14/21 0236 10/14/21 0445 10/15/21 0417  PROCALCITON 1.39  --  0.69  WBC 19.1*  --  12.2*  LATICACIDVEN 2.3* 1.2  --     Liver Function Tests: Recent Labs  Lab 10/14/21 0236 10/15/21 0417  AST 84* 85*  ALT 46* 50*  ALKPHOS 91 69  BILITOT 0.9 0.7  PROT 6.5 5.6*  ALBUMIN 2.9* 2.2*   Recent Labs  Lab 10/14/21 0236  LIPASE 38   No results for input(s): "AMMONIA" in the last 168 hours.  ABG    Component Value Date/Time   PHART 7.47 (H) 10/14/2021 1600   PCO2ART 27 (L) 10/14/2021 1600   PO2ART 92 10/14/2021 1600   HCO3  23.3 10/14/2021 2352   ACIDBASEDEF 2.6 (H) 10/14/2021 1600   O2SAT 97 10/14/2021 2352     Coagulation Profile: Recent Labs  Lab 10/14/21 0236  INR 1.4*    Cardiac Enzymes: Recent Labs  Lab 10/14/21 0236 10/15/21 0417  CKTOTAL 3,746* 3,369*    HbA1C: No results found for: "HGBA1C"  CBG: Recent Labs  Lab 10/14/21 1510 10/14/21 1619 10/14/21 1950 10/15/21 0005 10/15/21 0405  GLUCAP 93 85 91 214* 102*    Review of Systems: Positives in BOLD  Gen: poor po intake, fever, chills, weight change, fatigue, night sweats HEENT: Denies blurred vision, double vision, hearing loss, tinnitus, sinus congestion, rhinorrhea, sore throat, neck stiffness, dysphagia PULM: Denies shortness of breath, cough, sputum production, hemoptysis, wheezing CV: Denies chest pain, edema, orthopnea, paroxysmal nocturnal dyspnea, palpitations GI: Denies abdominal pain, nausea, vomiting, diarrhea, hematochezia, melena, constipation, change in bowel habits GU: Denies dysuria, hematuria, polyuria, oliguria, urethral discharge Endocrine: Denies hot or cold intolerance, polyuria,  polyphagia or appetite change Derm: Denies rash, dry skin, scaling or peeling skin change Heme: Denies easy bruising, bleeding, bleeding gums Neuro: falls, headache, numbness, weakness, slurred speech, loss of memory or consciousness  Past Medical History:  She,  has a past medical history of Hypertension and Kidney disease, chronic, stage III (moderate, EGFR 30-59 ml/min) ([place]).   Surgical History:  No past surgical history on file.   Social History:   reports that she quit smoking about 23 years ago. Her smoking use included cigarettes. She has a 0.50 pack-year smoking history. She has never used smokeless tobacco.   Family History:  Her family history includes Breast cancer in her mother.   Allergies No Known Allergies   Home Medications  Prior to Admission medications   Medication Sig Start Date End Date Taking? Authorizing Provider  amLODipine (NORVASC) 5 MG tablet Take 5 mg by mouth daily. Patient not taking: Reported on 10/14/2021    [provider]  aspirin EC 81 MG tablet Take 81 mg by mouth daily. Patient not taking: Reported on 10/14/2021    [provider]  chlorthalidone (HYGROTON) 25 MG tablet Take 25 mg by mouth daily. Patient not taking: Reported on 10/14/2021 08/11/18   [provider]  gabapentin (NEURONTIN) 100 MG capsule Take 1 tablet by mouth 2 (two) times daily. Patient not taking: Reported on 10/14/2021 04/21/19   [provider]  lisinopril (ZESTRIL) 20 MG tablet Take 20 mg by mouth daily. Patient not taking: Reported on 10/14/2021    [provider]  Vitamin D, Ergocalciferol, (DRISDOL) 1.25 MG (50000 UNIT) CAPS capsule Take 50,000 Units by mouth once a week. Patient not taking: Reported on 10/14/2021 05/29/21   [provider]     Critical care time: 32 minutes        Donell Beers, [place] Pager [phone] (please enter 7 digits) PCCM Consult Pager [phone] (please enter 7  digits)

## 2021-10-16 DIAGNOSIS — E66813 Obesity, class 3: Secondary | ICD-10-CM

## 2021-10-16 DIAGNOSIS — E872 Acidosis, unspecified: Secondary | ICD-10-CM

## 2021-10-16 DIAGNOSIS — G608 Other hereditary and idiopathic neuropathies: Secondary | ICD-10-CM

## 2021-10-16 DIAGNOSIS — R7401 Elevation of levels of liver transaminase levels: Secondary | ICD-10-CM

## 2021-10-16 DIAGNOSIS — I959 Hypotension, unspecified: Secondary | ICD-10-CM

## 2021-10-16 DIAGNOSIS — T796XXS Traumatic ischemia of muscle, sequela: Secondary | ICD-10-CM

## 2021-10-16 DIAGNOSIS — E875 Hyperkalemia: Secondary | ICD-10-CM

## 2021-10-16 DIAGNOSIS — N189 Chronic kidney disease, unspecified: Secondary | ICD-10-CM

## 2021-10-16 DIAGNOSIS — N179 Acute kidney failure, unspecified: Secondary | ICD-10-CM

## 2021-10-16 DIAGNOSIS — L89153 Pressure ulcer of sacral region, stage 3: Secondary | ICD-10-CM

## 2021-10-16 LAB — CBC
HCT: 30.1 % — ABNORMAL LOW (ref 36.0–46.0)
Hemoglobin: 9.9 g/dL — ABNORMAL LOW (ref 12.0–15.0)
MCH: 27 pg (ref 26.0–34.0)
MCHC: 32.9 g/dL (ref 30.0–36.0)
MCV: 82.2 fL (ref 80.0–100.0)
Platelets: 289 10*3/uL (ref 150–400)
RBC: 3.66 MIL/uL — ABNORMAL LOW (ref 3.87–5.11)
RDW: 13.9 % (ref 11.5–15.5)
WBC: 12.2 10*3/uL — ABNORMAL HIGH (ref 4.0–10.5)
nRBC: 0 % (ref 0.0–0.2)

## 2021-10-16 LAB — BASIC METABOLIC PANEL
Anion gap: 5 (ref 5–15)
BUN: 41 mg/dL — ABNORMAL HIGH (ref 6–20)
CO2: 23 mmol/L (ref 22–32)
Calcium: 8.1 mg/dL — ABNORMAL LOW (ref 8.9–10.3)
Chloride: 116 mmol/L — ABNORMAL HIGH (ref 98–111)
Creatinine, Ser: 1.76 mg/dL — ABNORMAL HIGH (ref 0.44–1.00)
GFR, Estimated: 33 mL/min — ABNORMAL LOW (ref >60)
Glucose, Bld: 88 mg/dL (ref 70–99)
Potassium: 4.1 mmol/L (ref 3.5–5.1)
Sodium: 144 mmol/L (ref 135–145)

## 2021-10-16 LAB — HCV INTERPRETATION

## 2021-10-16 LAB — GLUCOSE, CAPILLARY
Glucose-Capillary: 84 mg/dL (ref 70–99)
Glucose-Capillary: 109 mg/dL — ABNORMAL HIGH (ref 70–99)
Glucose-Capillary: 90 mg/dL (ref 70–99)
Glucose-Capillary: 114 mg/dL — ABNORMAL HIGH (ref 70–99)

## 2021-10-16 LAB — MAGNESIUM: Magnesium: 1.9 mg/dL (ref 1.7–2.4)

## 2021-10-16 LAB — URINE CULTURE: Culture: 200 — AB

## 2021-10-16 LAB — CK: Total CK: 1979 U/L — ABNORMAL HIGH (ref 38–234)

## 2021-10-16 LAB — HIV ANTIBODY (ROUTINE TESTING W REFLEX): HIV Screen 4th Generation wRfx: NONREACTIVE

## 2021-10-16 LAB — PROCALCITONIN: Procalcitonin: 0.24 ng/mL

## 2021-10-16 LAB — PHOSPHORUS: Phosphorus: 1.8 mg/dL — ABNORMAL LOW (ref 2.5–4.6)

## 2021-10-16 MED ORDER — SODIUM PHOSPHATES 45 MMOLE/15ML IV SOLN
30.0000 mmol | Freq: Once | INTRAVENOUS | Status: AC
  Administered 2021-10-16: 30 mmol via INTRAVENOUS
  Filled 2021-10-16: qty 10

## 2021-10-16 MED ORDER — POTASSIUM PHOSPHATES 15 MMOLE/5ML IV SOLN: 30.0000 mmol | Freq: Once | INTRAVENOUS | Status: DC

## 2021-10-16 NOTE — Progress Notes (Addendum)
  Progress Note   Patient: Karen Ray HCO:[id number] DOB: 1965/12/18 DOA: 10/14/2021     2 DOS: the patient was seen and examined on 10/16/2021    Assessment and Plan: * Rhabdomyolysis Acute traumatic rhabdomyolysis secondary to fall.  Continue IV fluids.  CPK trending down from 3746 down to 1979.  Acute kidney injury superimposed on CKD (HCC) Acute kidney injury on CKD stage IIIa.  Creatinine 7.54 on presentation and down to 1.76.  Hyperkalemia Secondary to acute kidney injury.  Improved with improving kidney function.  Pressure injury of skin Present on admission.  See full description below.  Appreciate wound care consultation.  Different stages with different locations.  Obesity, Class III, BMI 40-49.9 (morbid obesity) (HCC) With current height and weight in computer BMI 41.7   Hypophosphatemia Replace IV phosphorus today  Idiopathic sensorimotor axonal neuropathy Case discussed with our neurologist here who reviewed outpatient neurology work-up.  Recommends neuromuscular specialist as outpatient.  Transaminitis Secondary to hypotension and rhabdomyolysis  Metabolic acidosis Secondary to acute kidney injury  Hypotension Patient is on midodrine.  Septic shock ruled out.        Subjective: Patient states that she lives by herself and has trouble getting around.  She has not walked well in a long period of time.  As per OT she is able to transfer to an electric scooter.  She was admitted with fall and rhabdomyolysis and found to have wounds on her buttock.  Physical Exam: Vitals:   10/15/21 1630 10/15/21 1722 10/15/21 2341 10/16/21 0734  BP: 118/65 (!) 109/48 (!) 136/106 103/67  Pulse: 82 73 90 71  Resp: 20 16 18 16   Temp:  98.4 F (36.9 C) 98.3 F (36.8 C) 98.1 F (36.7 C)  TempSrc:      SpO2: 97% 98% 100% 99%  Weight:      Height:       Physical Exam HENT:     Head: Normocephalic.     Mouth/Throat:     Pharynx: No oropharyngeal exudate.  Eyes:      General: Lids are normal.     Conjunctiva/sclera: Conjunctivae normal.  Cardiovascular:     Rate and Rhythm: Normal rate and regular rhythm.     Heart sounds: Normal heart sounds, S1 normal and S2 normal.  Pulmonary:     Breath sounds: No decreased breath sounds, wheezing, rhonchi or rales.  Abdominal:     Palpations: Abdomen is soft.     Tenderness: There is no abdominal tenderness.  Musculoskeletal:     Right lower leg: No swelling.     Left lower leg: No swelling.  Neurological:     Comments: Unable to straight leg raise, unable to flex at the ankle, unable to wiggle toes bilaterally.  Foot drop bilaterally.  Both legs able to move with Babinski.     Data Reviewed: Case discussed with our neurologist about prior neurology work-up. Creatinine 1.76, AST 85 ALT 50, CPK 1979, hemoglobin 9.9, white blood cell count 12.2  Family Communication: Spoke with son on the phone  Disposition: Status is: Inpatient Remains inpatient appropriate because: Will need a rehab bed.  After short-term rehab will go live with daughter  Planned Discharge Destination: Skilled nursing facility    Time spent: 28 minutes Case discussed with our neurology team  Author: , MD 10/16/2021 1:56 PM  For on call review [email or web address].

## 2021-10-16 NOTE — TOC Progression Note (Signed)
Transition of Care [facility]) - Progression Note    Patient Details  Name: VERENIS NICOSIA MRN: [id number] Date of Birth: 1965-05-15  Transition of [facility] CM/SW Contact  Marlowe Sax, RN Phone Number: 10/16/2021, 12:47 PM  Clinical Narrative:     Called to speak to the patient's son Link Snuffer, Unable to reach him, left a general VM asking for a call back  Expected Discharge Plan: Home/Self Care Barriers to Discharge: Continued Medical Work up  Expected Discharge Plan and Services Expected Discharge Plan: Home/Self Care   Discharge Planning Services: CM Consult Post Acute Care Choice: Skilled Nursing Facility Living arrangements for the past 2 months: Apartment                 DME Arranged: N/A DME Agency: NA       HH Arranged: NA HH Agency: NA         Social Determinants of Health (SDOH) Interventions    Readmission Risk Interventions     No data to display

## 2021-10-16 NOTE — Assessment & Plan Note (Signed)
Secondary to acute kidney injury.  Improved with improving kidney function.

## 2021-10-16 NOTE — Assessment & Plan Note (Addendum)
Present on admission.  See full description below.  Appreciate wound care consultation.  Different stages with different locations.  Recommend air mattress and changing positions every 2 hours.  We will keep Foley catheter in for 2 weeks then discontinue to allow for decubiti to heal a little bit further.  Wash bilateral buttock wounds with soap and water, pat dry, apply nickel thick layer of Medihoney on the wounds and cover with dry gauze then foam dressing.

## 2021-10-16 NOTE — Consult Note (Signed)
PHARMACY CONSULT NOTE  Pharmacy Consult for Electrolyte Monitoring and Replacement   Recent Labs: Potassium (mmol/L)  Date Value  10/16/2021 4.1   Magnesium (mg/dL)  Date Value  02/05/7251 1.9   Calcium (mg/dL)  Date Value  66/44/0347 8.1 (L)   Albumin (g/dL)  Date Value  42/59/5638 2.2 (L)   Phosphorus (mg/dL)  Date Value  75/64/3329 1.8 (L)   Sodium (mmol/L)  Date Value  10/16/2021 144   Assessment: Patient is a 56 y/o F with medical history including CKD, HTN, neuropathy who is admitted with acute renal failure, rhabdomyolysis, and hypotension. Patient is currently admitted to the ICU. Pharmacy consulted to assist with electrolyte monitoring and replacement as indicated. Transferred to floor  MIVF: Acidosis improved after sodium bicarb. Now on 9% NaCl at 75 mL/hr.  Neph note 9/12: plan IVF for at least another 24 hrs  Goal of Therapy:  Electrolytes within normal limits  Plan:  K 4.1  Mag 1.9  Phos 1.8  Scr 3.16>>1.76 --Will replace with Sodium Phosphate 30 mmol IV x 1 given K>4.0.  renal fxn improving --Follow-up electrolytes with AM labs tomorrow   Bari Mantis PharmD Clinical Pharmacist 10/16/2021

## 2021-10-16 NOTE — Progress Notes (Signed)
[facility]  ROUNDING NOTE   Subjective:   Patient seen sitting up in bed Currently eating breakfast Alert and oriented Complains of soreness in legs  Objective:  Vital signs in last 24 hours:  Temp:  [98.1 F (36.7 C)-98.6 F (37 C)] 98.1 F (36.7 C) (09/13 0734) Pulse Rate:  [69-90] 71 (09/13 0734) Resp:  [16-24] 16 (09/13 0734) BP: (92-136)/(39-106) 103/67 (09/13 0734) SpO2:  [96 %-100 %] 99 % (09/13 0734)  Weight change:  Filed Weights   10/14/21 0110 10/14/21 0800 10/15/21 0445  Weight: 90.7 kg 107.1 kg 106.8 kg    Intake/Output: I/O last 3 completed shifts: In: 1284.6 [P.O.:120; I.V.:1164.6] Out: 3225 [Urine:3225]   Intake/Output this shift:  Total I/O In: 180 [P.O.:180] Out: -   Physical Exam: General: NAD  Head: Normocephalic, atraumatic. Moist oral mucosal membranes  Eyes: Anicteric  Lungs:  Clear to auscultation, normal effort, room air  Heart: Regular rate and rhythm  Abdomen:  Soft, nontender, nondistended  Extremities: 1+ peripheral edema.  Neurologic: Nonfocal, moving all four extremities  Skin: No lesions  Access: None    Basic Metabolic Panel: Recent Labs  Lab 10/14/21 0943 10/14/21 1516 10/14/21 2352 10/15/21 0407 10/15/21 0417 10/16/21 0432  NA 141 143 142 142  --  144  K 5.2* 4.3 3.8 3.8  --  4.1  CL 112* 111 110 111  --  116*  CO2 15* 19* 23 22  --  23  GLUCOSE 79 101* 128* 88  --  88  BUN 96* 91* 75* 68*  --  41*  CREATININE 6.26* 5.13* 3.78* 3.16*  --  1.76*  CALCIUM 8.9 8.5* 8.2* 8.1*  --  8.1*  MG  --   --   --   --  2.3 1.9  PHOS  --   --   --   --  3.1 1.8*     Liver Function Tests: Recent Labs  Lab 10/14/21 0236 10/15/21 0417  AST 84* 85*  ALT 46* 50*  ALKPHOS 91 69  BILITOT 0.9 0.7  PROT 6.5 5.6*  ALBUMIN 2.9* 2.2*    Recent Labs  Lab 10/14/21 0236  LIPASE 38    No results for input(s): "AMMONIA" in the last 168 hours.  CBC: Recent Labs  Lab 10/14/21 0236 10/15/21 0417  10/16/21 0432  WBC 19.1* 12.2* 12.2*  NEUTROABS 16.5*  --   --   HGB 10.6* 9.1* 9.9*  HCT 33.1* 26.4* 30.1*  MCV 84.0 80.2 82.2  PLT 303 262 289     Cardiac Enzymes: Recent Labs  Lab 10/14/21 0236 10/15/21 0417 10/16/21 0432  CKTOTAL 3,746* 3,369* 1,979*     BNP: Invalid input(s): "POCBNP"  CBG: Recent Labs  Lab 10/15/21 1130 10/15/21 1637 10/15/21 2051 10/15/21 2340 10/16/21 0432  GLUCAP 104* 125* 95 96 84     Microbiology: Results for orders placed or performed during the hospital encounter of 10/14/21  Blood Culture (routine x 2)     Status: None (Preliminary result)   Collection Time: 10/14/21  1:38 AM   Specimen: BLOOD  Result Value Ref Range Status   Specimen Description BLOOD RIGHT HAND  Final   Special Requests   Final    BOTTLES DRAWN AEROBIC AND ANAEROBIC Blood Culture results may not be optimal due to an inadequate volume of blood received in culture bottles   Culture   Final    NO GROWTH 2 DAYS Performed at [facility], [address] [place],  [place] [address]    Report Status PENDING  Incomplete  Urine Culture     Status: Abnormal   Collection Time: 10/14/21  1:38 AM   Specimen: In/Out Cath Urine  Result Value Ref Range Status   Specimen Description   Final    IN/OUT CATH URINE Performed at [facility], [address] [place], [place] [address]    Special Requests   Final    NONE Performed at [facility], [address] [place], [place] [address]    Culture 200 COLONIES/mL PROVIDENCIA STUARTII (A)  Final   Report Status 10/16/2021 FINAL  Final  SARS Coronavirus 2 by RT PCR (hospital order, performed in [facility] hospital lab) *cepheid single result test* Anterior Nasal Swab     Status: None   Collection Time: 10/14/21  1:38 AM   Specimen: Anterior Nasal Swab  Result Value Ref Range Status   SARS Coronavirus 2 by RT PCR NEGATIVE NEGATIVE Final    Comment: (NOTE) SARS-CoV-2 target nucleic acids are  NOT DETECTED.  The SARS-CoV-2 RNA is generally detectable in upper and lower respiratory specimens during the acute phase of infection. The lowest concentration of SARS-CoV-2 viral copies this assay can detect is 250 copies / mL. A negative result does not preclude SARS-CoV-2 infection and should not be used as the sole basis for treatment or other patient management decisions.  A negative result may occur with improper specimen collection / handling, submission of specimen other than nasopharyngeal swab, presence of viral mutation(s) within the areas targeted by this assay, and inadequate number of viral copies (<250 copies / mL). A negative result must be combined with clinical observations, patient history, and epidemiological information.  Fact Sheet for Patients:   RoadLapTop.co.za  Fact Sheet for Healthcare Providers: http://kim-miller.com/  This test is not yet approved or  cleared by the [place] FDA and has been authorized for detection and/or diagnosis of SARS-CoV-2 by FDA under an Emergency Use Authorization (EUA).  This EUA will remain in effect (meaning this test can be used) for the duration of the COVID-19 declaration under Section 564(b)(1) of the Act, 21 U.S.C. section 360bbb-3(b)(1), unless the authorization is terminated or revoked sooner.  Performed at [facility], [address] [place], [place] [address]   Blood Culture (routine x 2)     Status: None (Preliminary result)   Collection Time: 10/14/21  2:15 AM   Specimen: BLOOD  Result Value Ref Range Status   Specimen Description BLOOD LEFT HAND  Final   Special Requests   Final    BOTTLES DRAWN AEROBIC AND ANAEROBIC Blood Culture results may not be optimal due to an inadequate volume of blood received in culture bottles   Culture   Final    NO GROWTH 2 DAYS Performed at [facility], [address], [place], [place] [address]     Report Status PENDING  Incomplete  MRSA Next Gen by PCR, Nasal     Status: None   Collection Time: 10/14/21  8:13 AM   Specimen: Nasal Mucosa; Nasal Swab  Result Value Ref Range Status   MRSA by PCR Next Gen NOT DETECTED NOT DETECTED Final    Comment: (NOTE) The GeneXpert MRSA Assay (FDA approved for NASAL specimens only), is one component of a comprehensive MRSA colonization surveillance program. It is not intended to diagnose MRSA infection nor to guide or monitor treatment for MRSA infections. Test performance is not FDA approved in patients less than 2 years  old. Performed at [facility], [address] [place], [place] [address]     Coagulation Studies: Recent Labs    10/14/21 0236  LABPROT 16.9*  INR 1.4*     Urinalysis: Recent Labs    10/14/21 0138  COLORURINE AMBER*  LABSPEC 1.023  PHURINE 5.0  GLUCOSEU NEGATIVE  HGBUR MODERATE*  BILIRUBINUR NEGATIVE  KETONESUR NEGATIVE  PROTEINUR 30*  NITRITE NEGATIVE  LEUKOCYTESUR NEGATIVE       Imaging: US Venous Img Lower Bilateral (DVT)  Result Date: 10/14/2021 CLINICAL DATA:  Lower extremity edema. EXAM: BILATERAL LOWER EXTREMITY VENOUS DOPPLER ULTRASOUND TECHNIQUE: Gray-scale sonography with graded compression, as well as color Doppler and duplex ultrasound were performed to evaluate the lower extremity deep venous systems from the level of the common femoral vein and including the common femoral, femoral, profunda femoral, popliteal and calf veins including the posterior tibial, peroneal and gastrocnemius veins when visible. The superficial great saphenous vein was also interrogated. Spectral Doppler was utilized to evaluate flow at rest and with distal augmentation maneuvers in the common femoral, femoral and popliteal veins. COMPARISON:  CT CAP, 10/14/2021. FINDINGS: Suboptimal evaluation, with poor acoustic penetration secondary to patient habitus. RIGHT LOWER EXTREMITY VENOUS Normal compressibility of  the RIGHT common femoral, superficial femoral, and popliteal veins, as well as the visualized calf veins. Visualized portions of profunda femoral vein and great saphenous vein unremarkable. No filling defects to suggest DVT on grayscale or color Doppler imaging. Doppler waveforms show normal direction of venous flow, normal respiratory plasticity and response to augmentation. OTHER No evidence of superficial thrombophlebitis or abnormal fluid collection. Limitations: Patient body habitus LEFT LOWER EXTREMITY VENOUS Normal compressibility of the LEFT common femoral, superficial femoral, and popliteal veins, as well as the visualized calf veins. Visualized portions of profunda femoral vein and great saphenous vein unremarkable. No filling defects to suggest DVT on grayscale or color Doppler imaging. Doppler waveforms show normal direction of venous flow, normal respiratory plasticity and response to augmentation. OTHER No evidence of superficial thrombophlebitis or abnormal fluid collection. Limitations: Patient body habitus IMPRESSION: Suboptimal evaluation, within these constraints; No evidence of femoropopliteal DVT or superficial thrombophlebitis within either lower extremity. Michaelle Birks, MD [facility] Radiology Electronically Signed   By: Michaelle Birks M.D.   On: 10/14/2021 11:51     Medications:    sodium chloride     sodium chloride 75 mL/hr at 10/15/21 1300   sodium phosphate 30 mmol in dextrose 5 % 250 mL infusion 30 mmol (10/16/21 0910)    vitamin C  500 mg Oral BID   aspirin EC  81 mg Oral Daily   Chlorhexidine Gluconate Cloth  6 each Topical Daily   feeding supplement  237 mL Oral TID BM   gabapentin  100 mg Oral BID   heparin  5,000 Units Subcutaneous Q8H   leptospermum manuka honey  1 Application Topical Daily   midodrine  10 mg Oral TID WC   multivitamin with minerals  1 tablet Oral Daily   pantoprazole  40 mg Oral Daily   zinc oxide    Topical TID   acetaminophen, docusate sodium, mouth rinse, polyethylene glycol  Assessment/ Plan:  Karen Ray is a 56 y.o.  female with past medical conditions including hypertension, sensorimotor nephropathy, and chronic kidney disease stage IIIa, who was admitted to [facility] on 10/14/2021 for Rhabdomyolysis [M62.82] Septic shock ([place]) [A41.9, R65.21] Acute renal failure, unspecified acute renal failure type ([place]) [N17.9] Pressure injury  of skin of sacral region, unspecified injury stage [L89.159].   Acute kidney injury with hyperkalemia on chronic kidney disease stage IIIa.  Baseline creatinine appears to be 2.3 from 10/03/2020.  Appears patient is followed by [facility] nephrology.  CT abdomen pelvis negative for obstruction however patient has 2 cm cyst in right kidney.Acute kidney injury likely secondary to hypovolemia with a component of Rhabdomylitis. Potassium on admission 5.6.    Creatinine continues to improve. 2.2L of urine output recorded. Appetite appropriate. Will continue to monitor renal recovery.   Lab Results  Component Value Date   CREATININE 1.76 (H) 10/16/2021   CREATININE 3.16 (H) 10/15/2021   CREATININE 3.78 (H) 10/14/2021    Intake/Output Summary (Last 24 hours) at 10/16/2021 1137 Last data filed at 10/16/2021 1006 Gross per 24 hour  Intake 764.18 ml  Output 2200 ml  Net -1435.82 ml    2.  Hypertension with chronic kidney disease.  Home regimen includes amlodipine, chlorthalidone, and lisinopril.  These medications currently held.  Currently receiving midodrine 10 mg 3 times daily, blood pressure remains soft but stable.   3.  Acute metabolic acidosis.  Serum bicarb on admission 13.  Corrected with sodium bicarb infusion.    LOS: 2 Sanuel Ladnier 9/13/202311:37 AM

## 2021-10-16 NOTE — Assessment & Plan Note (Addendum)
Acute kidney injury on CKD stage IIIa.  Creatinine 7.54 on presentation and down to 1.30.

## 2021-10-16 NOTE — Assessment & Plan Note (Signed)
With current height and weight in computer BMI 41.7

## 2021-10-16 NOTE — Assessment & Plan Note (Addendum)
Case discussed with our neurologist here on 9/13 who reviewed outpatient neurology work-up.  Recommends neuromuscular specialist as outpatient.

## 2021-10-16 NOTE — Progress Notes (Signed)
Occupational Therapy Treatment Patient Details Name: Karen Ray MRN: [id number] DOB: 01/07/1966 Today's Date: 10/16/2021   History of present illness Karen Ray is a 56 y.o. female whose medical history includes only hypertension and possible relatively minor chronic kidney disease.  She has also had some numbness and tingling and a right leg for which she has seen neurology. She presents tonight by EMS for evaluation after a fall or possibly 2 falls.  History is limited, but reportedly she fell earlier today and refused to come to the emergency department. She then fell a second time and was found between the couch and her motorized wheelchair.  She is denying any acute pain and she says she has no shortness of breath.  She has extensive amounts of dried stool on her backside   OT comments  Upon entering the room, pt supine in bed and agreeable to OT intervention. Nurse tech arrived to room to assess pt's skin but needing assistance as pt is +2 for task. Pt needing total A to roll L <> R. Pt was not soiled but sheets needing to be changed during this session. B LE prevalon boots donned as well. Pt washing face with set up A and applying lotion to face and UB. OT discussing recommendations with pt reporting plans for rehab and then to live with daughter in [place]. Pt reports her daughter has not seen her in some time so she may not know pt's true condition. At this point, OT would recommend hoyer lift transfer for staff and pt safety of transfers attempted. Pt was very pleasant during session but remains very weak.    Recommendations for follow up therapy are one component of a multi-disciplinary discharge planning process, led by the attending physician.  Recommendations may be updated based on patient status, additional functional criteria and insurance authorization.    Follow Up Recommendations  Skilled nursing-short term rehab (<3 hours/day)    Assistance Recommended at Discharge  Frequent or constant Supervision/Assistance  Patient can return home with the following  Assist for transportation;Assistance with cooking/housework;Two people to help with walking and/or transfers;Two people to help with bathing/dressing/bathroom;Help with stairs or ramp for entrance   Equipment Recommendations  Other (comment) (defer to next venue of care) If pt declines SNF, she would need hospital bed, hoyer lift, and hoyer sling.       Precautions / Restrictions Precautions Precautions: Fall Restrictions Weight Bearing Restrictions: No       Mobility Bed Mobility Overal bed mobility: Needs Assistance Bed Mobility: Rolling Rolling: Total assist              Transfers                   General transfer comment: not appropriate         ADL either performed or assessed with clinical judgement   ADL Overall ADL's : Needs assistance/impaired     Grooming: Supervision/safety;Set up;Wash/dry face;Bed level                                      Extremity/Trunk Assessment Upper Extremity Assessment Upper Extremity Assessment: Generalized weakness   Lower Extremity Assessment Lower Extremity Assessment: Generalized weakness        Vision Patient Visual Report: No change from baseline            Cognition Arousal/Alertness: Awake/alert Behavior During Therapy: WFL for tasks assessed/performed  Overall Cognitive Status: Within Functional Limits for tasks assessed                                 General Comments: pleasant and agreeable              General Comments presents with excessive B LE edema, however no weeping noted this date    Pertinent Vitals/ Pain       Pain Assessment Pain Assessment: Faces Faces Pain Scale: Hurts even more Pain Location: buttocks Pain Descriptors / Indicators: Discomfort, Grimacing Pain Intervention(s): Limited activity within patient's tolerance, Repositioned          Frequency  Min 2X/week        Progress Toward Goals  OT Goals(current goals can now be found in the care plan section)  Progress towards OT goals: Progressing toward goals  Acute Rehab OT Goals Patient Stated Goal: to get stronger OT Goal Formulation: With patient Time For Goal Achievement: 10/28/21 Potential to Achieve Goals: Fair  Plan Discharge plan remains appropriate;Frequency remains appropriate       AM-PAC OT "6 Clicks" Daily Activity     Outcome Measure   Help from another person eating meals?: None Help from another person taking care of personal grooming?: A Lot Help from another person toileting, which includes using toliet, bedpan, or urinal?: Total Help from another person bathing (including washing, rinsing, drying)?: Total Help from another person to put on and taking off regular upper body clothing?: Total Help from another person to put on and taking off regular lower body clothing?: Total 6 Click Score: 10    End of Session    OT Visit Diagnosis: Unsteadiness on feet (R26.81);Repeated falls (R29.6);Muscle weakness (generalized) (M62.81);History of falling (Z91.81);Pain   Activity Tolerance Patient limited by pain   Patient Left in bed   Nurse Communication Mobility status        Time: 1039-1100 OT Time Calculation (min): 21 min  Charges: OT General Charges $OT Visit: 1 Visit OT Treatments $Self Care/Home Management : 8-22 mins  Jackquline Denmark, MS, OTR/L , CBIS ascom [phone]  10/16/21, 12:26 PM

## 2021-10-16 NOTE — Assessment & Plan Note (Addendum)
Replace oral phosphorus today

## 2021-10-16 NOTE — Assessment & Plan Note (Signed)
Secondary to acute kidney injury   

## 2021-10-16 NOTE — Assessment & Plan Note (Addendum)
Acute traumatic rhabdomyolysis secondary to fall.  CPK trending down from 3746 down to 493.

## 2021-10-16 NOTE — Assessment & Plan Note (Addendum)
Patient is on midodrine.  Septic shock ruled out.  Taper to midodrine 5 mg 3 times daily.  Hopefully can decrease further tomorrow

## 2021-10-16 NOTE — Assessment & Plan Note (Signed)
Secondary to hypotension and rhabdomyolysis

## 2021-10-16 NOTE — Progress Notes (Signed)
Physical Therapy Treatment Patient Details Name: Karen Ray MRN: [id number] DOB: 1965/12/14 Today's Date: 10/16/2021   History of Present Illness Karen Ray is a 56 y.o. female whose medical history includes only hypertension and possible relatively minor chronic kidney disease.  She has also had some numbness and tingling and a right leg for which she has seen neurology. She presents tonight by EMS for evaluation after a fall or possibly 2 falls.  History is limited, but reportedly she fell earlier today and refused to come to the emergency department. She then fell a second time and was found between the couch and her motorized wheelchair.  She is denying any acute pain and she says she has no shortness of breath.  She has extensive amounts of dried stool on her backside    PT Comments    Pt is making limited progress towards goals and presents with trace activation of B LEs along with excessive edema. Will need total assist for all mobility including recommendation for hoyer lift if dc to home environment. Hospital bed would also be necessary as pt unable to change positions without significant assist. Educated on skin integrity with heels floated. Given B bed rails and pt unable to weight shift and raise shoulders off bed. +2 for all mobility. Will continue to progress as able.   Recommendations for follow up therapy are one component of a multi-disciplinary discharge planning process, led by the attending physician.  Recommendations may be updated based on patient status, additional functional criteria and insurance authorization.  Follow Up Recommendations  Skilled nursing-short term rehab (<3 hours/day) Can patient physically be transported by private vehicle: No   Assistance Recommended at Discharge Frequent or constant Supervision/Assistance  Patient can return home with the following Two people to help with walking and/or transfers;Two people to help with  bathing/dressing/bathroom   Equipment Recommendations  Hospital bed (hoyer lift)    Recommendations for Other Services       Precautions / Restrictions Precautions Precautions: Fall Restrictions Weight Bearing Restrictions: No     Mobility  Bed Mobility Overal bed mobility: Needs Assistance Bed Mobility: Supine to Sit, Sit to Supine     Supine to sit: Total assist Sit to supine: Total assist   General bed mobility comments: unable to initiate mobility with B LEs with 1/5 strength noted. Once closer to EOB, unable to demonstrate trunkal control and almost slid off bed, needing total assist for going back to bed and repositioning including trend bed. Pt able to use B UE on bed rails to assist with sliding up towards HOB.    Transfers                   General transfer comment: not appropriate    Ambulation/Gait                   Stairs             Wheelchair Mobility    Modified Rankin (Stroke Patients Only)       Balance Overall balance assessment: Needs assistance   Sitting balance-Leahy Scale: Zero Sitting balance - Comments: unable to achieve sitting at EOB as unsafe                                    Cognition Arousal/Alertness: Awake/alert Behavior During Therapy: WFL for tasks assessed/performed Overall Cognitive Status: Within Functional Limits for tasks assessed  General Comments: pleasant and agreeable        Exercises Other Exercises Other Exercises: attempted supine ther-ex, however trace muscle activiation noted. no active movement of DF, positioning with heel floating at end of session    General Comments General comments (skin integrity, edema, etc.): presents with excessive B LE edema, however no weeping noted this date      Pertinent Vitals/Pain Pain Assessment Pain Assessment: No/denies pain    Home Living                           Prior Function            PT Goals (current goals can now be found in the care plan section) Acute Rehab PT Goals Patient Stated Goal: to get stronger PT Goal Formulation: With patient Time For Goal Achievement: 10/28/21 Potential to Achieve Goals: Good Progress towards PT goals: Progressing toward goals    Frequency    Min 2X/week      PT Plan Current plan remains appropriate    Co-evaluation              AM-PAC PT "6 Clicks" Mobility   Outcome Measure  Help needed turning from your back to your side while in a flat bed without using bedrails?: Total Help needed moving from lying on your back to sitting on the side of a flat bed without using bedrails?: Total Help needed moving to and from a bed to a chair (including a wheelchair)?: Total Help needed standing up from a chair using your arms (e.g., wheelchair or bedside chair)?: Total Help needed to walk in hospital room?: Total Help needed climbing 3-5 steps with a railing? : Total 6 Click Score: 6    End of Session   Activity Tolerance: Patient tolerated treatment well Patient left: in bed Nurse Communication: Mobility status PT Visit Diagnosis: Muscle weakness (generalized) (M62.81);Repeated falls (R29.6);Difficulty in walking, not elsewhere classified (R26.2)     Time: 4403-4742 PT Time Calculation (min) (ACUTE ONLY): 15 min  Charges:  $Therapeutic Activity: 8-22 mins                     Karen Ray, PT, DPT, GCS [phone]    Karen Ray 10/16/2021, 11:57 AM

## 2021-10-16 NOTE — TOC Progression Note (Signed)
Transition of Care [facility]) - Progression Note    Patient Details  Name: Karen Ray MRN: [id number] Date of Birth: 1965/02/14  Transition of Care [facility]) CM/SW Contact  Marlowe Sax, RN Phone Number: 10/16/2021, 11:37 AM  Clinical Narrative:    Spoke with the patient at the bedside, she stated that she does live in [place] in an apartment alone, She stated that she was initially planning to go to [facility] SNF but since she would have to agree to sign over her check that would not be the plan, she plans to DC to her daughter's in [place], I asked her if she planned on doing that at DC and she stated that she would contact her daughter to verify that was the plan I explained that we need to work out the DC plan and ,make sure that her daughter is also on board if she is planning to go to her house when she discharges, she stated that she would call her daughter and her son to verify  Expected Discharge Plan: Home/Self Care Barriers to Discharge: Continued Medical Work up  Expected Discharge Plan and Services Expected Discharge Plan: Home/Self Care   Discharge Planning Services: CM Consult Post Acute Care Choice: Skilled Nursing Facility Living arrangements for the past 2 months: Apartment                 DME Arranged: N/A DME Agency: NA       HH Arranged: NA HH Agency: NA         Social Determinants of Health (SDOH) Interventions    Readmission Risk Interventions     No data to display

## 2021-10-17 DIAGNOSIS — G608 Other hereditary and idiopathic neuropathies: Secondary | ICD-10-CM

## 2021-10-17 DIAGNOSIS — N179 Acute kidney failure, unspecified: Secondary | ICD-10-CM | POA: Diagnosis not present

## 2021-10-17 DIAGNOSIS — D638 Anemia in other chronic diseases classified elsewhere: Secondary | ICD-10-CM

## 2021-10-17 DIAGNOSIS — T796XXS Traumatic ischemia of muscle, sequela: Secondary | ICD-10-CM | POA: Diagnosis not present

## 2021-10-17 DIAGNOSIS — E875 Hyperkalemia: Secondary | ICD-10-CM | POA: Diagnosis not present

## 2021-10-17 LAB — CK: Total CK: 919 U/L — ABNORMAL HIGH (ref 38–234)

## 2021-10-17 LAB — CBC
HCT: 30 % — ABNORMAL LOW (ref 36.0–46.0)
Hemoglobin: 9.7 g/dL — ABNORMAL LOW (ref 12.0–15.0)
MCH: 27.1 pg (ref 26.0–34.0)
MCHC: 32.3 g/dL (ref 30.0–36.0)
MCV: 83.8 fL (ref 80.0–100.0)
Platelets: 299 10*3/uL (ref 150–400)
RBC: 3.58 MIL/uL — ABNORMAL LOW (ref 3.87–5.11)
RDW: 14.1 % (ref 11.5–15.5)
WBC: 13.2 10*3/uL — ABNORMAL HIGH (ref 4.0–10.5)
nRBC: 0 % (ref 0.0–0.2)

## 2021-10-17 LAB — BASIC METABOLIC PANEL
Anion gap: 3 — ABNORMAL LOW (ref 5–15)
BUN: 26 mg/dL — ABNORMAL HIGH (ref 6–20)
CO2: 25 mmol/L (ref 22–32)
Calcium: 8.3 mg/dL — ABNORMAL LOW (ref 8.9–10.3)
Chloride: 116 mmol/L — ABNORMAL HIGH (ref 98–111)
Creatinine, Ser: 1.3 mg/dL — ABNORMAL HIGH (ref 0.44–1.00)
GFR, Estimated: 48 mL/min — ABNORMAL LOW (ref >60)
Glucose, Bld: 93 mg/dL (ref 70–99)
Potassium: 4.6 mmol/L (ref 3.5–5.1)
Sodium: 144 mmol/L (ref 135–145)

## 2021-10-17 LAB — MAGNESIUM: Magnesium: 1.9 mg/dL (ref 1.7–2.4)

## 2021-10-17 LAB — GLUCOSE, CAPILLARY
Glucose-Capillary: 85 mg/dL (ref 70–99)
Glucose-Capillary: 92 mg/dL (ref 70–99)

## 2021-10-17 LAB — PHOSPHORUS: Phosphorus: 1.9 mg/dL — ABNORMAL LOW (ref 2.5–4.6)

## 2021-10-17 LAB — COPPER, SERUM: Copper: 109 ug/dL (ref 80–158)

## 2021-10-17 LAB — VITAMIN B1: Vitamin B1 (Thiamine): 63.9 nmol/L — ABNORMAL LOW (ref 66.5–200.0)

## 2021-10-17 LAB — ZINC: Zinc: 40 ug/dL — ABNORMAL LOW (ref 44–115)

## 2021-10-17 MED ORDER — ZINC SULFATE 220 (50 ZN) MG PO CAPS
220.0000 mg | ORAL_CAPSULE | Freq: Every day | ORAL | Status: DC
  Administered 2021-10-18: 220 mg via ORAL
  Filled 2021-10-17: qty 1

## 2021-10-17 MED ORDER — SODIUM PHOSPHATES 45 MMOLE/15ML IV SOLN
30.0000 mmol | Freq: Once | INTRAVENOUS | Status: AC
  Administered 2021-10-17: 30 mmol via INTRAVENOUS
  Filled 2021-10-17: qty 10

## 2021-10-17 MED ORDER — MIDODRINE HCL 5 MG PO TABS
5.0000 mg | ORAL_TABLET | Freq: Three times a day (TID) | ORAL | Status: DC
  Administered 2021-10-17 – 2021-10-18 (×3): 5 mg via ORAL
  Filled 2021-10-17 (×3): qty 1

## 2021-10-17 MED ORDER — MAGNESIUM OXIDE -MG SUPPLEMENT 400 (240 MG) MG PO TABS
400.0000 mg | ORAL_TABLET | Freq: Every day | ORAL | Status: DC
  Administered 2021-10-17 – 2021-10-18 (×2): 400 mg via ORAL
  Filled 2021-10-17 (×2): qty 1

## 2021-10-17 NOTE — TOC Progression Note (Signed)
Transition of Care [facility]) - Progression Note    Patient Details  Name: Karen Ray MRN: [id number] Date of Birth: [birth year]  Transition of Care [facility]) CM/SW Contact  Marlowe Sax, RN Phone Number: 10/17/2021, 9:39 AM  Clinical Narrative:    Spoke with the son and the patient, I explained that she will need to stay in Rehab for the 30 days and sign over the check, they are both understanding and agree, her Son Link Snuffer stated that he will take her to [place] once she gets out of rehab, They accepted the bed from [facility], I notified allison They will have a bed today   Expected Discharge Plan: Home/Self Care Barriers to Discharge: Continued Medical Work up  Expected Discharge Plan and Services Expected Discharge Plan: Home/Self Care   Discharge Planning Services: CM Consult Post Acute Care Choice: Skilled Nursing Facility Living arrangements for the past 2 months: Apartment                 DME Arranged: N/A DME Agency: NA       HH Arranged: NA HH Agency: NA         Social Determinants of Health (SDOH) Interventions    Readmission Risk Interventions     No data to display

## 2021-10-17 NOTE — Assessment & Plan Note (Signed)
Last ferritin 354 and last hemoglobin 9.7.

## 2021-10-17 NOTE — Consult Note (Signed)
PHARMACY CONSULT NOTE  Pharmacy Consult for Electrolyte Monitoring and Replacement   Recent Labs: Potassium (mmol/L)  Date Value  10/17/2021 4.6   Magnesium (mg/dL)  Date Value  16/08/3708 1.9   Calcium (mg/dL)  Date Value  62/69/4854 8.3 (L)   Albumin (g/dL)  Date Value  62/70/3500 2.2 (L)   Phosphorus (mg/dL)  Date Value  93/81/8299 1.9 (L)   Sodium (mmol/L)  Date Value  10/17/2021 144   Assessment: Patient is a 56 y/o F with medical history including CKD, HTN, neuropathy who is admitted with acute renal failure, rhabdomyolysis, and hypotension. Patient is currently admitted to the ICU. Pharmacy consulted to assist with electrolyte monitoring and replacement as indicated. Transferred to floor  MIVF: Acidosis improved after sodium bicarb. Now on 9% NaCl at 75 mL/hr.  Neph note 9/12: plan IVF for at least another 24 hrs  Goal of Therapy:  Electrolytes within normal limits  Plan:  Phos remains low despite repletion yesterday. Per report, patient is at high risk fo refeeding. Give sodium phosphate 30 mmol IV x1  Reassess labs tomorrow morning   Selinda Eon Clinical Pharmacist 10/17/2021 9:41 AM

## 2021-10-17 NOTE — Progress Notes (Signed)
  Progress Note   Patient: Karen Ray ZOX:[id number] DOB: 1965/08/03 DOA: 10/14/2021     3 DOS: the patient was seen and examined on 10/17/2021     Assessment and Plan: * Rhabdomyolysis Acute traumatic rhabdomyolysis secondary to fall.  Continue IV fluids.  CPK trending down from 3746 down to 919.  Acute kidney injury superimposed on CKD (HCC) Acute kidney injury on CKD stage IIIa.  Creatinine 7.54 on presentation and down to 1.30.  Hyperkalemia Secondary to acute kidney injury.  Improved with improving kidney function.  Pressure injury of skin Present on admission.  See full description below.  Appreciate wound care consultation.  Different stages with different locations.  Anemia of chronic disease Last ferritin 354 and last hemoglobin 9.7.  Obesity, Class III, BMI 40-49.9 (morbid obesity) (HCC) With current height and weight in computer BMI 41.7   Hypophosphatemia Replace IV phosphorus again today  Idiopathic sensorimotor axonal neuropathy Case discussed with our neurologist here on 9/13 who reviewed outpatient neurology work-up.  Recommends neuromuscular specialist as outpatient.  Transaminitis Secondary to hypotension and rhabdomyolysis  Metabolic acidosis Secondary to acute kidney injury  Hypotension Patient is on midodrine.  Septic shock ruled out.  Taper to midodrine 5 mg 3 times daily.  Hopefully can decrease further tomorrow        Subjective: Patient feeling okay.  Phosphorus again low today.  Physical Exam: Vitals:   10/16/21 0734 10/16/21 1520 10/16/21 2315 10/17/21 0759  BP: 103/67 (!) 105/48 (!) 116/46 (!) 114/43  Pulse: 71 67 60 67  Resp: 16 16 20 18   Temp: 98.1 F (36.7 C) 99.1 F (37.3 C) 99.7 F (37.6 C) 98.1 F (36.7 C)  TempSrc:      SpO2: 99% 97% 96% 96%  Weight:      Height:       Physical Exam HENT:     Head: Normocephalic.     Mouth/Throat:     Pharynx: No oropharyngeal exudate.  Eyes:     General: Lids are normal.      Conjunctiva/sclera: Conjunctivae normal.  Cardiovascular:     Rate and Rhythm: Normal rate and regular rhythm.     Heart sounds: Normal heart sounds, S1 normal and S2 normal.  Pulmonary:     Breath sounds: No decreased breath sounds, wheezing, rhonchi or rales.  Abdominal:     Palpations: Abdomen is soft.     Tenderness: There is no abdominal tenderness.  Musculoskeletal:     Right lower leg: No swelling.     Left lower leg: No swelling.  Neurological:     Comments: Unable to straight leg raise, unable to flex at the ankle, unable to wiggle toes bilaterally.  Foot drop bilaterally.  Both legs able to move with Babinski.     Data Reviewed: Platelets count 13.2, hemoglobin 9.7, platelet count 299, creatinine 1.3, phosphorus 1.9  Family Communication: Left message for patient's son  Disposition: Status is: Inpatient Remains inpatient appropriate because: Replacing IV phosphorus again today.  Trending muscle enzyme.  Planned Discharge Destination: Rehab    Time spent: 28 minutes  Author: , MD 10/17/2021 2:05 PM  For on call review [email or web address].

## 2021-10-17 NOTE — Progress Notes (Signed)
[facility] [place] [facility]  ROUNDING NOTE   Subjective:   Patient lying in the bed today.  Denies any acute complaints or shortness of breath. Complains of leg soreness.  Objective:  Vital signs in last 24 hours:  Temp:  [98.1 F (36.7 C)-99.7 F (37.6 C)] 98.1 F (36.7 C) (09/14 0759) Pulse Rate:  [60-67] 67 (09/14 0759) Resp:  [16-20] 18 (09/14 0759) BP: (105-116)/(43-48) 114/43 (09/14 0759) SpO2:  [96 %-97 %] 96 % (09/14 0759)  Weight change:  Filed Weights   10/14/21 0110 10/14/21 0800 10/15/21 0445  Weight: 90.7 kg 107.1 kg 106.8 kg    Intake/Output: I/O last 3 completed shifts: In: 300 [P.O.:300] Out: 3600 [Urine:3600]   Intake/Output this shift:  No intake/output data recorded.  Physical Exam: General: NAD  Head: Normocephalic, atraumatic. Moist oral mucosal membranes  Eyes: Anicteric  Lungs:  Clear to auscultation, normal effort, room air  Heart: Regular rate and rhythm  Abdomen:  Soft, nontender, nondistended  Extremities: Trace peripheral edema.  Neurologic: Nonfocal, able to answer questions appropriately.  Skin: Feet in soft boots   Access: None    Basic Metabolic Panel: Recent Labs  Lab 10/14/21 1516 10/14/21 2352 10/15/21 0407 10/15/21 0417 10/16/21 0432 10/17/21 0541  NA 143 142 142  --  144 144  K 4.3 3.8 3.8  --  4.1 4.6  CL 111 110 111  --  116* 116*  CO2 19* 23 22  --  23 25  GLUCOSE 101* 128* 88  --  88 93  BUN 91* 75* 68*  --  41* 26*  CREATININE 5.13* 3.78* 3.16*  --  1.76* 1.30*  CALCIUM 8.5* 8.2* 8.1*  --  8.1* 8.3*  MG  --   --   --  2.3 1.9 1.9  PHOS  --   --   --  3.1 1.8* 1.9*     Liver Function Tests: Recent Labs  Lab 10/14/21 0236 10/15/21 0417  AST 84* 85*  ALT 46* 50*  ALKPHOS 91 69  BILITOT 0.9 0.7  PROT 6.5 5.6*  ALBUMIN 2.9* 2.2*    Recent Labs  Lab 10/14/21 0236  LIPASE 38    No results for input(s): "AMMONIA" in the last 168 hours.  CBC: Recent Labs  Lab 10/14/21 0236 10/15/21 0417  10/16/21 0432 10/17/21 0541  WBC 19.1* 12.2* 12.2* 13.2*  NEUTROABS 16.5*  --   --   --   HGB 10.6* 9.1* 9.9* 9.7*  HCT 33.1* 26.4* 30.1* 30.0*  MCV 84.0 80.2 82.2 83.8  PLT 303 262 289 299     Cardiac Enzymes: Recent Labs  Lab 10/14/21 0236 10/15/21 0417 10/16/21 0432 10/17/21 0541  CKTOTAL 3,746* 3,369* 1,979* 919*     BNP: Invalid input(s): "POCBNP"  CBG: Recent Labs  Lab 10/16/21 1212 10/16/21 1626 10/16/21 2019 10/17/21 0012 10/17/21 0402  GLUCAP 109* 90 114* 92 85     Microbiology: Results for orders placed or performed during the hospital encounter of 10/14/21  Blood Culture (routine x 2)     Status: None (Preliminary result)   Collection Time: 10/14/21  1:38 AM   Specimen: BLOOD  Result Value Ref Range Status   Specimen Description BLOOD RIGHT HAND  Final   Special Requests   Final    BOTTLES DRAWN AEROBIC AND ANAEROBIC Blood Culture results may not be optimal due to an inadequate volume of blood received in culture bottles   Culture   Final    NO GROWTH 3 DAYS  Performed at [facility], [address] [place], [place] [address]    Report Status PENDING  Incomplete  Urine Culture     Status: Abnormal   Collection Time: 10/14/21  1:38 AM   Specimen: In/Out Cath Urine  Result Value Ref Range Status   Specimen Description   Final    IN/OUT CATH URINE Performed at [facility], [address], [place], [place] [address]    Special Requests   Final    NONE Performed at [facility], [address] [place], [place] [address]    Culture 200 COLONIES/mL PROVIDENCIA STUARTII (A)  Final   Report Status 10/16/2021 FINAL  Final  SARS Coronavirus 2 by RT PCR (hospital order, performed in [facility] hospital lab) *cepheid single result test* Anterior Nasal Swab     Status: None   Collection Time: 10/14/21  1:38 AM   Specimen: Anterior Nasal Swab  Result Value Ref Range Status   SARS Coronavirus 2 by RT PCR NEGATIVE  NEGATIVE Final    Comment: (NOTE) SARS-CoV-2 target nucleic acids are NOT DETECTED.  The SARS-CoV-2 RNA is generally detectable in upper and lower respiratory specimens during the acute phase of infection. The lowest concentration of SARS-CoV-2 viral copies this assay can detect is 250 copies / mL. A negative result does not preclude SARS-CoV-2 infection and should not be used as the sole basis for treatment or other patient management decisions.  A negative result may occur with improper specimen collection / handling, submission of specimen other than nasopharyngeal swab, presence of viral mutation(s) within the areas targeted by this assay, and inadequate number of viral copies (<250 copies / mL). A negative result must be combined with clinical observations, patient history, and epidemiological information.  Fact Sheet for Patients:   RoadLapTop.co.za  Fact Sheet for Healthcare Providers: http://kim-miller.com/  This test is not yet approved or  cleared by the [place] FDA and has been authorized for detection and/or diagnosis of SARS-CoV-2 by FDA under an Emergency Use Authorization (EUA).  This EUA will remain in effect (meaning this test can be used) for the duration of the COVID-19 declaration under Section 564(b)(1) of the Act, 21 U.S.C. section 360bbb-3(b)(1), unless the authorization is terminated or revoked sooner.  Performed at [facility], [address] [place], [place] [address]   Blood Culture (routine x 2)     Status: None (Preliminary result)   Collection Time: 10/14/21  2:15 AM   Specimen: BLOOD  Result Value Ref Range Status   Specimen Description BLOOD LEFT HAND  Final   Special Requests   Final    BOTTLES DRAWN AEROBIC AND ANAEROBIC Blood Culture results may not be optimal due to an inadequate volume of blood received in culture bottles   Culture   Final    NO GROWTH 3 DAYS Performed at  [facility], [address], [place], [place] [address]    Report Status PENDING  Incomplete  MRSA Next Gen by PCR, Nasal     Status: None   Collection Time: 10/14/21  8:13 AM   Specimen: Nasal Mucosa; Nasal Swab  Result Value Ref Range Status   MRSA by PCR Next Gen NOT DETECTED NOT DETECTED Final    Comment: (NOTE) The GeneXpert MRSA Assay (FDA approved for NASAL specimens only), is one component of a comprehensive MRSA colonization surveillance program. It is not intended to diagnose MRSA infection nor to guide or monitor treatment for MRSA infections. Test performance  is not FDA approved in patients less than 68 years old. Performed at [facility], [address] [place], [place] [address]     Coagulation Studies: No results for input(s): "LABPROT", "INR" in the last 72 hours.   Urinalysis: No results for input(s): "COLORURINE", "LABSPEC", "PHURINE", "GLUCOSEU", "HGBUR", "BILIRUBINUR", "KETONESUR", "PROTEINUR", "UROBILINOGEN", "NITRITE", "LEUKOCYTESUR" in the last 72 hours.  Invalid input(s): "APPERANCEUR"     Imaging: No results found.   Medications:    sodium chloride     sodium chloride 75 mL/hr at 10/15/21 1300   sodium phosphate 30 mmol in dextrose 5 % 250 mL infusion 30 mmol (10/17/21 0927)    vitamin C  500 mg Oral BID   aspirin EC  81 mg Oral Daily   Chlorhexidine Gluconate Cloth  6 each Topical Daily   feeding supplement  237 mL Oral TID BM   gabapentin  100 mg Oral BID   heparin  5,000 Units Subcutaneous Q8H   leptospermum manuka honey  1 Application Topical Daily   magnesium oxide  400 mg Oral Daily   midodrine  5 mg Oral TID WC   multivitamin with minerals  1 tablet Oral Daily   pantoprazole  40 mg Oral Daily   zinc oxide   Topical TID   acetaminophen, docusate sodium, mouth rinse  Assessment/ Plan:  Karen Ray is a 56 y.o.  female with past medical conditions including hypertension, sensorimotor nephropathy, and  chronic kidney disease stage IIIa, who was admitted to [facility] on 10/14/2021 for Rhabdomyolysis [M62.82] Septic shock (HCC) [A41.9, R65.21] Acute renal failure, unspecified acute renal failure type (HCC) [N17.9] Pressure injury of skin of sacral region, unspecified injury stage [L89.159].   Acute kidney injury with hyperkalemia on chronic kidney disease stage IIIa.  Baseline creatinine appears to be 2.3 from 10/03/2020.  patient is followed by [facility] nephrology.  CT abdomen pelvis negative for obstruction however patient has 2 cm cyst in right kidney.Acute kidney injury likely secondary to hypovolemia with a component of Rhabdo. Potassium on admission 5.6.    Creatinine continues to improve. good urine output recorded. Appetite appropriate. Will continue to monitor renal recovery.   Lab Results  Component Value Date   CREATININE 1.30 (H) 10/17/2021   CREATININE 1.76 (H) 10/16/2021   CREATININE 3.16 (H) 10/15/2021    Intake/Output Summary (Last 24 hours) at 10/17/2021 1441 Last data filed at 10/17/2021 0442 Gross per 24 hour  Intake 120 ml  Output 2200 ml  Net -2080 ml    2.  Hypertension with chronic kidney disease.  Home regimen includes amlodipine, chlorthalidone, and lisinopril.  These medications currently held.  Currently receiving midodrine 3 times daily, blood pressure remains soft but stable.   3.  Acute metabolic acidosis.  Serum bicarb on admission 13.  Corrected with sodium bicarb infusion.  Bicarbonate level now corrected. We will discontinue maintenance IV fluid as patient's oral intake appears to be adequate. We will sign off.  Please reconsult as necessary.    LOS: 3 Karen Ray 9/14/20232:41 PM

## 2021-10-18 DIAGNOSIS — E875 Hyperkalemia: Secondary | ICD-10-CM | POA: Diagnosis not present

## 2021-10-18 DIAGNOSIS — N179 Acute kidney failure, unspecified: Secondary | ICD-10-CM | POA: Diagnosis not present

## 2021-10-18 DIAGNOSIS — T796XXS Traumatic ischemia of muscle, sequela: Secondary | ICD-10-CM | POA: Diagnosis not present

## 2021-10-18 DIAGNOSIS — D638 Anemia in other chronic diseases classified elsewhere: Secondary | ICD-10-CM | POA: Diagnosis not present

## 2021-10-18 LAB — VITAMIN A: Vitamin A (Retinoic Acid): 9 ug/dL — ABNORMAL LOW (ref 20.1–62.0)

## 2021-10-18 LAB — VITAMIN E
Vitamin E (Alpha Tocopherol): 6.9 mg/L — ABNORMAL LOW (ref 7.0–25.1)
Vitamin E(Gamma Tocopherol): 1.2 mg/L (ref 0.5–5.5)

## 2021-10-18 LAB — BASIC METABOLIC PANEL
Anion gap: 6 (ref 5–15)
BUN: 15 mg/dL (ref 6–20)
CO2: 22 mmol/L (ref 22–32)
Calcium: 8.6 mg/dL — ABNORMAL LOW (ref 8.9–10.3)
Chloride: 113 mmol/L — ABNORMAL HIGH (ref 98–111)
Creatinine, Ser: 1.12 mg/dL — ABNORMAL HIGH (ref 0.44–1.00)
GFR, Estimated: 57 mL/min — ABNORMAL LOW (ref >60)
Glucose, Bld: 90 mg/dL (ref 70–99)
Potassium: 4.4 mmol/L (ref 3.5–5.1)
Sodium: 141 mmol/L (ref 135–145)

## 2021-10-18 LAB — VITAMIN B6: Vitamin B6: 3.3 ug/L — ABNORMAL LOW (ref 3.4–65.2)

## 2021-10-18 LAB — CK: Total CK: 493 U/L — ABNORMAL HIGH (ref 38–234)

## 2021-10-18 LAB — MAGNESIUM: Magnesium: 1.7 mg/dL (ref 1.7–2.4)

## 2021-10-18 LAB — VITAMIN C: Vitamin C: 0.6 mg/dL (ref 0.4–2.0)

## 2021-10-18 LAB — PHOSPHORUS: Phosphorus: 2.2 mg/dL — ABNORMAL LOW (ref 2.5–4.6)

## 2021-10-18 MED ORDER — THIAMINE MONONITRATE 100 MG PO TABS
100.0000 mg | ORAL_TABLET | Freq: Two times a day (BID) | ORAL | Status: DC
  Administered 2021-10-18: 100 mg via ORAL
  Filled 2021-10-18: qty 1

## 2021-10-18 MED ORDER — ADULT MULTIVITAMIN W/MINERALS CH: 1.0000 | ORAL_TABLET | Freq: Every day | ORAL | 0 refills | Status: AC

## 2021-10-18 MED ORDER — VITAMIN A 3 MG (10000 UNIT) PO CAPS
10000.0000 [IU] | ORAL_CAPSULE | Freq: Every day | ORAL | Status: DC
  Administered 2021-10-18: 10000 [IU] via ORAL
  Filled 2021-10-18: qty 1

## 2021-10-18 MED ORDER — ZINC OXIDE 20 % EX OINT: TOPICAL_OINTMENT | Freq: Three times a day (TID) | CUTANEOUS | 0 refills | Status: AC

## 2021-10-18 MED ORDER — VITAMIN E 180 MG (400 UNIT) PO CAPS
1.0000 | ORAL_CAPSULE | Freq: Every day | ORAL | Status: DC
  Administered 2021-10-18: 400 [IU] via ORAL
  Filled 2021-10-18: qty 1

## 2021-10-18 MED ORDER — VITAMIN A 3 MG (10000 UNIT) PO CAPS: 10000.0000 [IU] | ORAL_CAPSULE | Freq: Every day | ORAL | 0 refills | Status: AC

## 2021-10-18 MED ORDER — K PHOS MONO-SOD PHOS DI & MONO 155-852-130 MG PO TABS
500.0000 mg | ORAL_TABLET | Freq: Once | ORAL | Status: AC
  Administered 2021-10-18: 500 mg via ORAL
  Filled 2021-10-18: qty 2

## 2021-10-18 MED ORDER — PYRIDOXINE HCL 25 MG PO TABS
25.0000 mg | ORAL_TABLET | Freq: Every day | ORAL | Status: DC
  Filled 2021-10-18: qty 1

## 2021-10-18 MED ORDER — MAGNESIUM SULFATE 2 GM/50ML IV SOLN
2.0000 g | Freq: Once | INTRAVENOUS | Status: AC
  Administered 2021-10-18: 2 g via INTRAVENOUS
  Filled 2021-10-18: qty 50

## 2021-10-18 MED ORDER — DOCUSATE SODIUM 100 MG PO CAPS: 100.0000 mg | ORAL_CAPSULE | Freq: Two times a day (BID) | ORAL | 0 refills | Status: AC | PRN

## 2021-10-18 MED ORDER — ASCORBIC ACID 500 MG PO TABS: 500.0000 mg | ORAL_TABLET | Freq: Two times a day (BID) | ORAL | 0 refills | Status: AC

## 2021-10-18 MED ORDER — MIDODRINE HCL 5 MG PO TABS: 2.5000 mg | ORAL_TABLET | Freq: Three times a day (TID) | ORAL | Status: DC

## 2021-10-18 MED ORDER — VITAMIN E 180 MG (400 UNIT) PO CAPS: 1.0000 | ORAL_CAPSULE | Freq: Every day | ORAL | 0 refills | Status: AC

## 2021-10-18 MED ORDER — MAGNESIUM OXIDE -MG SUPPLEMENT 400 (240 MG) MG PO TABS: 400.0000 mg | ORAL_TABLET | Freq: Every day | ORAL | 0 refills | Status: AC

## 2021-10-18 MED ORDER — VITAMIN B-1 100 MG PO TABS: 100.0000 mg | ORAL_TABLET | Freq: Two times a day (BID) | ORAL | 0 refills | Status: AC

## 2021-10-18 MED ORDER — MIDODRINE HCL 2.5 MG PO TABS: 2.5000 mg | ORAL_TABLET | Freq: Three times a day (TID) | ORAL | 0 refills | Status: AC

## 2021-10-18 MED ORDER — ENSURE ENLIVE PO LIQD: 237.0000 mL | Freq: Three times a day (TID) | ORAL | 0 refills | Status: AC

## 2021-10-18 MED ORDER — MEDIHONEY WOUND/BURN DRESSING EX PSTE: 1.0000 | PASTE | Freq: Every day | CUTANEOUS | 0 refills | Status: AC

## 2021-10-18 MED ORDER — ZINC SULFATE 220 (50 ZN) MG PO CAPS: 220.0000 mg | ORAL_CAPSULE | Freq: Every day | ORAL | 0 refills | Status: AC

## 2021-10-18 MED ORDER — PYRIDOXINE HCL 25 MG PO TABS: 25.0000 mg | ORAL_TABLET | Freq: Every day | ORAL | 0 refills | Status: AC

## 2021-10-18 MED ORDER — ACETAMINOPHEN 325 MG PO TABS: 650.0000 mg | ORAL_TABLET | ORAL | Status: AC | PRN

## 2021-10-18 NOTE — Assessment & Plan Note (Signed)
Replace IV magnesium prior to discharge and oral magnesium after discharge.

## 2021-10-18 NOTE — Plan of Care (Signed)
°  Problem: Clinical Measurements: °Goal: Diagnostic test results will improve °Outcome: Progressing °Goal: Respiratory complications will improve °Outcome: Progressing °Goal: Cardiovascular complication will be avoided °Outcome: Progressing °  °Problem: Nutrition: °Goal: Adequate nutrition will be maintained °Outcome: Progressing °  °Problem: Coping: °Goal: Level of anxiety will decrease °Outcome: Progressing °  °Problem: Pain Managment: °Goal: General experience of comfort will improve °Outcome: Progressing °  °

## 2021-10-18 NOTE — Discharge Summary (Addendum)
Physician Discharge Summary   Patient: Karen Ray MRN: [id number] DOB: December 15, 1965  Admit date:     10/14/2021  Discharge date: 10/18/21  Discharge Physician: Loletha Grayer   PCP: Marguerita Merles, MD   Recommendations at discharge:   Follow-up team at rehab 1 day Will need a neuromuscular specialist as outpatient  Discharge Diagnoses: Principal Problem:   Rhabdomyolysis Active Problems:   Acute kidney injury superimposed on CKD ([place])   Hyperkalemia   Pressure injury of skin   Hypotension   Metabolic acidosis   Transaminitis   Idiopathic sensorimotor axonal neuropathy   Hypophosphatemia   Obesity, Class III, BMI 40-49.9 (morbid obesity) ([place])   Anemia of chronic disease   Hypomagnesemia    Hospital Course: The patient was admitted to the ICU on 10/14/2021 for hypotension, rhabdomyolysis, metabolic acidosis and acute kidney injury.  Patient initially required pressors and sodium bicarb drip.  The patient is off pressors and now on midodrine tapering dose.  If blood pressure remains in the 120s can potentially get rid of midodrine.  The patient has a history of idiopathic sensorimotor axonal neuropathy and has no movement of her lower extremities.  She was living by herself and transferring to her motor scooter.  Patient was also noted to have numerous decubiti.  We will leave Foley catheter in for 1-2 weeks to allow decubiti to heal would look further then discontinue Foley catheter.  Assessment and Plan: * Rhabdomyolysis Acute traumatic rhabdomyolysis secondary to fall.  CPK trending down from 3746 down to 493.  Acute kidney injury superimposed on CKD ([place]) Acute kidney injury on CKD stage IIIa.  Creatinine 7.54 on presentation and down to 1.12 with GFR 57.  Hyperkalemia Secondary to acute kidney injury.  Improved with improving kidney function.  Pressure injury of skin Present on admission.  See full description below.  Appreciate wound care consultation.  Different  stages with different locations.  Recommend air mattress and changing positions every 2 hours.  We will keep Foley catheter in for 2 weeks then discontinue to allow for decubiti to heal a little bit further.  Wash bilateral buttock wounds with soap and water, pat dry, apply nickel thick layer of Medihoney on the wounds and cover with dry gauze then foam dressing.  Hypomagnesemia Replace IV magnesium prior to discharge and oral magnesium after discharge.  Anemia of chronic disease Last ferritin 354 and last hemoglobin 9.7.  Obesity, Class III, BMI 40-49.9 (morbid obesity) ([facility]) With current height and weight in computer BMI 41.7   Hypophosphatemia Replace oral phosphorus today  Idiopathic sensorimotor axonal neuropathy Case discussed with our neurologist here on 9/13 who reviewed outpatient neurology work-up.  Recommends neuromuscular specialist as outpatient.  Transaminitis Secondary to hypotension and rhabdomyolysis  Metabolic acidosis Secondary to acute kidney injury  Hypotension Patient is on midodrine.  Septic shock ruled out.  Taper to midodrine 2.5 mg 3 times daily.  Hopefully can get rid of this medication in a few days.         Consultants: Wound care, nephrology Procedures performed: None Disposition: Skilled nursing facility Diet recommendation:  Regular diet DISCHARGE MEDICATION: Allergies as of 10/18/2021   No Known Allergies      Medication List     STOP taking these medications    amLODipine 5 MG tablet Commonly known as: NORVASC   chlorthalidone 25 MG tablet Commonly known as: HYGROTON   lisinopril 20 MG tablet Commonly known as: ZESTRIL   Vitamin D (Ergocalciferol) 1.25 MG (50000 UNIT)  Caps capsule Commonly known as: DRISDOL       TAKE these medications    acetaminophen 325 MG tablet Commonly known as: TYLENOL Take 2 tablets (650 mg total) by mouth every 4 (four) hours as needed for mild pain (temp > 101.5).   ascorbic acid 500 MG  tablet Commonly known as: VITAMIN C Take 1 tablet (500 mg total) by mouth 2 (two) times daily.   aspirin EC 81 MG tablet Take 81 mg by mouth daily.   docusate sodium 100 MG capsule Commonly known as: COLACE Take 1 capsule (100 mg total) by mouth 2 (two) times daily as needed for mild constipation.   feeding supplement Liqd Take 237 mLs by mouth 3 (three) times daily between meals.   gabapentin 100 MG capsule Commonly known as: NEURONTIN Take 1 tablet by mouth 2 (two) times daily.   leptospermum manuka honey Pste paste Apply 1 Application topically daily. Start taking on: October 19, 2021   magnesium oxide 400 (240 Mg) MG tablet Commonly known as: MAG-OX Take 1 tablet (400 mg total) by mouth daily. Start taking on: October 19, 2021   midodrine 2.5 MG tablet Commonly known as: PROAMATINE Take 1 tablet (2.5 mg total) by mouth 3 (three) times daily with meals.   multivitamin with minerals Tabs tablet Take 1 tablet by mouth daily. Start taking on: October 19, 2021   pyridOXINE 25 MG tablet Commonly known as: VITAMIN B6 Take 1 tablet (25 mg total) by mouth daily.   thiamine 100 MG tablet Commonly known as: Vitamin B-1 Take 1 tablet (100 mg total) by mouth 2 (two) times daily.   vitamin A 3 MG (10000 UNITS) capsule Take 1 capsule (10,000 Units total) by mouth daily.   Vitamin E 180 MG (400 UNIT) Caps Take 1 capsule (400 Units total) by mouth daily.   zinc oxide 20 % ointment Apply topically 3 (three) times daily.   zinc sulfate 220 (50 Zn) MG capsule Take 1 capsule (220 mg total) by mouth daily. Start taking on: October 19, 2021        Contact information for after-discharge care     Destination     [facility] .   Service: Skilled Nursing Contact information: [address] [place] [address] [phone]                    Discharge Exam: Filed Weights   10/14/21 0110  10/14/21 0800 10/15/21 0445  Weight: 90.7 kg 107.1 kg 106.8 kg   Physical Exam HENT:     Head: Normocephalic.     Mouth/Throat:     Pharynx: No oropharyngeal exudate.  Eyes:     General: Lids are normal.     Conjunctiva/sclera: Conjunctivae normal.  Cardiovascular:     Rate and Rhythm: Normal rate and regular rhythm.     Heart sounds: Normal heart sounds, S1 normal and S2 normal.  Pulmonary:     Breath sounds: No decreased breath sounds, wheezing, rhonchi or rales.  Abdominal:     Palpations: Abdomen is soft.     Tenderness: There is no abdominal tenderness.  Musculoskeletal:     Right lower leg: No swelling.     Left lower leg: No swelling.  Neurological:     Comments: Unable to straight leg raise, unable to flex at the ankle, unable to wiggle toes bilaterally.  Foot drop bilaterally.  Both legs able to move with Babinski.  Condition at discharge: stable  The results of significant diagnostics from this hospitalization (including imaging, microbiology, ancillary and laboratory) are listed below for reference.   Imaging Studies: ECHOCARDIOGRAM COMPLETE  Result Date: 10/14/2021    ECHOCARDIOGRAM REPORT   Patient Name:   DEYONA WEINS Date of Exam: 10/14/2021 Medical Rec #:  [id number]        Height:       63.0 in Accession #:    [id number]       Weight:       236.1 lb Date of Birth:  04-10-1965        BSA:          2.075 m Patient Age:    1 years         BP:           108/59 mmHg Patient Gender: F                HR:           105 bpm. Exam Location:  [facility] Procedure: 2D Echo, Cardiac Doppler and Color Doppler Indications:     Abnormal ECG R94.31  History:         Patient has no prior history of Echocardiogram examinations.                  Risk Factors:Hypertension.  Sonographer:     Sherrie Sport Referring Phys:  [id number] Teressa Lower Diagnosing Phys: Kathlyn Sacramento MD  Sonographer Comments: Suboptimal apical window and no subcostal window. IMPRESSIONS  1. Left ventricular  ejection fraction, by estimation, is 55 to 60%. The left ventricle has normal function. The left ventricle has no regional wall motion abnormalities. Left ventricular diastolic parameters are consistent with Grade I diastolic dysfunction (impaired relaxation).  2. Right ventricular systolic function is normal. The right ventricular size is normal. Tricuspid regurgitation signal is inadequate for assessing PA pressure.  3. The mitral valve is normal in structure. No evidence of mitral valve regurgitation. No evidence of mitral stenosis.  4. The aortic valve is normal in structure. Aortic valve regurgitation is not visualized. No aortic stenosis is present. FINDINGS  Left Ventricle: Left ventricular ejection fraction, by estimation, is 55 to 60%. The left ventricle has normal function. The left ventricle has no regional wall motion abnormalities. The left ventricular internal cavity size was normal in size. There is  no left ventricular hypertrophy. Left ventricular diastolic parameters are consistent with Grade I diastolic dysfunction (impaired relaxation). Right Ventricle: The right ventricular size is normal. No increase in right ventricular wall thickness. Right ventricular systolic function is normal. Tricuspid regurgitation signal is inadequate for assessing PA pressure. Left Atrium: Left atrial size was normal in size. Right Atrium: Right atrial size was normal in size. Pericardium: There is no evidence of pericardial effusion. Mitral Valve: The mitral valve is normal in structure. No evidence of mitral valve regurgitation. No evidence of mitral valve stenosis. Tricuspid Valve: The tricuspid valve is normal in structure. Tricuspid valve regurgitation is not demonstrated. No evidence of tricuspid stenosis. Aortic Valve: The aortic valve is normal in structure. Aortic valve regurgitation is not visualized. No aortic stenosis is present. Aortic valve mean gradient measures 5.5 mmHg. Aortic valve peak gradient  measures 11.5 mmHg. Aortic valve area, by VTI measures 2.82 cm. Pulmonic Valve: The pulmonic valve was normal in structure. Pulmonic valve regurgitation is not visualized. No evidence of pulmonic stenosis. Aorta: The aortic root is normal in size and structure. Venous: The  inferior vena cava was not well visualized. IAS/Shunts: No atrial level shunt detected by color flow Doppler.  LEFT VENTRICLE PLAX 2D LVIDd:         4.00 cm   Diastology LVIDs:         2.90 cm   LV e' medial:    6.42 cm/s LV PW:         1.10 cm   LV E/e' medial:  10.1 LV IVS:        0.80 cm   LV e' lateral:   5.66 cm/s LVOT diam:     2.00 cm   LV E/e' lateral: 11.4 LV SV:         67 LV SV Index:   32 LVOT Area:     3.14 cm  RIGHT VENTRICLE RV S prime:     19.10 cm/s TAPSE (M-mode): 2.9 cm LEFT ATRIUM             Index        RIGHT ATRIUM           Index LA diam:        2.80 cm 1.35 cm/m   RA Area:     16.90 cm LA Vol (A2C):   45.6 ml 21.98 ml/m  RA Volume:   44.30 ml  21.35 ml/m LA Vol (A4C):   51.9 ml 25.01 ml/m LA Biplane Vol: 48.9 ml 23.57 ml/m  AORTIC VALVE AV Area (Vmax):    2.30 cm AV Area (Vmean):   2.66 cm AV Area (VTI):     2.82 cm AV Vmax:           169.50 cm/s AV Vmean:          105.450 cm/s AV VTI:            0.237 m AV Peak Grad:      11.5 mmHg AV Mean Grad:      5.5 mmHg LVOT Vmax:         124.00 cm/s LVOT Vmean:        89.400 cm/s LVOT VTI:          0.213 m LVOT/AV VTI ratio: 0.90  AORTA Ao Root diam: 2.50 cm MITRAL VALVE               TRICUSPID VALVE MV Area (PHT): 5.58 cm    TR Peak grad:   11.6 mmHg MV Decel Time: 136 msec    TR Vmax:        170.00 cm/s MV E velocity: 64.70 cm/s MV A velocity: 81.40 cm/s  SHUNTS MV E/A ratio:  0.79        Systemic VTI:  0.21 m                            Systemic Diam: 2.00 cm Kathlyn Sacramento MD Electronically signed by Kathlyn Sacramento MD Signature Date/Time: 10/14/2021/2:10:53 PM    Final    US Venous Img Lower Bilateral (DVT)  Result Date: 10/14/2021 CLINICAL DATA:  Lower extremity  edema. EXAM: BILATERAL LOWER EXTREMITY VENOUS DOPPLER ULTRASOUND TECHNIQUE: Gray-scale sonography with graded compression, as well as color Doppler and duplex ultrasound were performed to evaluate the lower extremity deep venous systems from the level of the common femoral vein and including the common femoral, femoral, profunda femoral, popliteal and calf veins including the posterior tibial, peroneal and gastrocnemius veins when visible. The superficial great saphenous vein was also interrogated. Spectral Doppler  was utilized to evaluate flow at rest and with distal augmentation maneuvers in the common femoral, femoral and popliteal veins. COMPARISON:  CT CAP, 10/14/2021. FINDINGS: Suboptimal evaluation, with poor acoustic penetration secondary to patient habitus. RIGHT LOWER EXTREMITY VENOUS Normal compressibility of the RIGHT common femoral, superficial femoral, and popliteal veins, as well as the visualized calf veins. Visualized portions of profunda femoral vein and great saphenous vein unremarkable. No filling defects to suggest DVT on grayscale or color Doppler imaging. Doppler waveforms show normal direction of venous flow, normal respiratory plasticity and response to augmentation. OTHER No evidence of superficial thrombophlebitis or abnormal fluid collection. Limitations: Patient body habitus LEFT LOWER EXTREMITY VENOUS Normal compressibility of the LEFT common femoral, superficial femoral, and popliteal veins, as well as the visualized calf veins. Visualized portions of profunda femoral vein and great saphenous vein unremarkable. No filling defects to suggest DVT on grayscale or color Doppler imaging. Doppler waveforms show normal direction of venous flow, normal respiratory plasticity and response to augmentation. OTHER No evidence of superficial thrombophlebitis or abnormal fluid collection. Limitations: Patient body habitus IMPRESSION: Suboptimal evaluation, within these constraints; No evidence of  femoropopliteal DVT or superficial thrombophlebitis within either lower extremity. Michaelle Birks, MD [facility] Radiology Electronically Signed   By: Michaelle Birks M.D.   On: 10/14/2021 11:51   CT HEAD WO CONTRAST (5MM)  Result Date: 10/14/2021 CLINICAL DATA:  Mental status change.  Fall.  A EXAM: CT HEAD WITHOUT CONTRAST TECHNIQUE: Contiguous axial images were obtained from the base of the skull through the vertex without intravenous contrast. RADIATION DOSE REDUCTION: This exam was performed according to the departmental dose-optimization program which includes automated exposure control, adjustment of the mA and/or kV according to patient size and/or use of iterative reconstruction technique. COMPARISON:  MRI 03/25/2019 FINDINGS: Brain: Generalized atrophic changes, advanced for age. No focal abnormality affecting the brainstem or cerebellum. Moderate to marked chronic small-vessel ischemic changes of the cerebral hemispheric white matter. No sign of acute infarction. No mass, hemorrhage, hydrocephalus or extra-axial collection Vascular: There is atherosclerotic calcification of the major vessels at the base of the brain. Skull: Negative Sinuses/Orbits: Clear/normal Other: None IMPRESSION: No acute or traumatic finding. Age advanced brain volume loss. Moderate to marked chronic small-vessel ischemic changes of the cerebral hemispheric white matter. Electronically Signed   By: Nelson Chimes M.D.   On: 10/14/2021 07:50   CT CHEST ABDOMEN PELVIS WO CONTRAST  Result Date: 10/14/2021 CLINICAL DATA:  Sepsis EXAM: CT CHEST, ABDOMEN AND PELVIS WITHOUT CONTRAST TECHNIQUE: Multidetector CT imaging of the chest, abdomen and pelvis was performed following the standard protocol without IV contrast. RADIATION DOSE REDUCTION: This exam was performed according to the departmental dose-optimization program which includes automated exposure control, adjustment of the mA and/or  kV according to patient size and/or use of iterative reconstruction technique. COMPARISON:  03/04/2007 chest CT FINDINGS: CT CHEST FINDINGS Cardiovascular: No significant vascular findings. Normal heart size. No pericardial effusion. Mediastinum/Nodes: Negative for adenopathy or mass. Mild dependent atelectasis Lungs/Pleura: There is no edema, consolidation, effusion, or pneumothorax. Musculoskeletal: No acute finding CT ABDOMEN PELVIS FINDINGS Hepatobiliary: No focal liver abnormality.No evidence of biliary obstruction or stone. Pancreas: Unremarkable. Spleen: Unremarkable. Adrenals/Urinary Tract: Negative adrenals. No hydronephrosis or stone. 2 cm cystic density arising from the right kidney. Unremarkable bladder. Stomach/Bowel: No obstruction. No appendicitis. Rectal distant structure at stool distended rectum. No generalized stool retention. Vascular/Lymphatic: No acute vascular abnormality. No mass or adenopathy. Reproductive:No pathologic findings. Other: No ascites  or pneumoperitoneum. Musculoskeletal: No acute abnormalities. Degenerative facet spurring at the lower lumbar levels. IMPRESSION: No acute finding or explanation for sepsis. Electronically Signed   By: Jorje Guild M.D.   On: 10/14/2021 05:28   DG Chest Port 1 View  Result Date: 10/14/2021 CLINICAL DATA:  Multiple falls; questionable sepsis EXAM: PORTABLE CHEST 1 VIEW COMPARISON:  Radiographs 02/26/2007 FINDINGS: No focal consolidation, pleural effusion, or pneumothorax. Normal cardiomediastinal silhouette. No acute osseous abnormality. No displaced rib fractures. IMPRESSION: No acute abnormality. Electronically Signed   By: Placido Sou M.D.   On: 10/14/2021 01:52    Microbiology: Results for orders placed or performed during the hospital encounter of 10/14/21  Blood Culture (routine x 2)     Status: None (Preliminary result)   Collection Time: 10/14/21  1:38 AM   Specimen: BLOOD  Result Value Ref Range Status   Specimen  Description BLOOD RIGHT HAND  Final   Special Requests   Final    BOTTLES DRAWN AEROBIC AND ANAEROBIC Blood Culture results may not be optimal due to an inadequate volume of blood received in culture bottles   Culture   Final    NO GROWTH 4 DAYS Performed at [facility], [address] [place], [place] [address]    Report Status PENDING  Incomplete  Urine Culture     Status: Abnormal   Collection Time: 10/14/21  1:38 AM   Specimen: In/Out Cath Urine  Result Value Ref Range Status   Specimen Description   Final    IN/OUT CATH URINE Performed at [facility], [address] [place], [place] [address]    Special Requests   Final    NONE Performed at [facility], [address] [place], [place] [address]    Culture 200 COLONIES/mL PROVIDENCIA STUARTII (A)  Final   Report Status 10/16/2021 FINAL  Final  SARS Coronavirus 2 by RT PCR (hospital order, performed in [facility] hospital lab) *cepheid single result test* Anterior Nasal Swab     Status: None   Collection Time: 10/14/21  1:38 AM   Specimen: Anterior Nasal Swab  Result Value Ref Range Status   SARS Coronavirus 2 by RT PCR NEGATIVE NEGATIVE Final    Comment: (NOTE) SARS-CoV-2 target nucleic acids are NOT DETECTED.  The SARS-CoV-2 RNA is generally detectable in upper and lower respiratory specimens during the acute phase of infection. The lowest concentration of SARS-CoV-2 viral copies this assay can detect is 250 copies / mL. A negative result does not preclude SARS-CoV-2 infection and should not be used as the sole basis for treatment or other patient management decisions.  A negative result may occur with improper specimen collection / handling, submission of specimen other than nasopharyngeal swab, presence of viral mutation(s) within the areas targeted by this assay, and inadequate number of viral copies (<250 copies / mL). A negative result must be combined with clinical observations,  patient history, and epidemiological information.  Fact Sheet for Patients:   https://www.patel.info/  Fact Sheet for Healthcare Providers: https://hall.com/  This test is not yet approved or  cleared by the [place] FDA and has been authorized for detection and/or diagnosis of SARS-CoV-2 by FDA under an Emergency Use Authorization (EUA).  This EUA will remain in effect (meaning this test can be used) for the duration of the COVID-19 declaration under Section 564(b)(1) of the Act, 21 U.S.C. section 360bbb-3(b)(1), unless the authorization is terminated or revoked sooner.  Performed at [facility], [address] [place], [place] [address]   Blood Culture (routine x 2)     Status: None (Preliminary result)   Collection Time: 10/14/21  2:15 AM   Specimen: BLOOD  Result Value Ref Range Status   Specimen Description BLOOD LEFT HAND  Final   Special Requests   Final    BOTTLES DRAWN AEROBIC AND ANAEROBIC Blood Culture results may not be optimal due to an inadequate volume of blood received in culture bottles   Culture   Final    NO GROWTH 4 DAYS Performed at [facility], [address] [place], [place] [address]    Report Status PENDING  Incomplete  MRSA Next Gen by PCR, Nasal     Status: None   Collection Time: 10/14/21  8:13 AM   Specimen: Nasal Mucosa; Nasal Swab  Result Value Ref Range Status   MRSA by PCR Next Gen NOT DETECTED NOT DETECTED Final    Comment: (NOTE) The GeneXpert MRSA Assay (FDA approved for NASAL specimens only), is one component of a comprehensive MRSA colonization surveillance program. It is not intended to diagnose MRSA infection nor to guide or monitor treatment for MRSA infections. Test performance is not FDA approved in patients less than 4 years old. Performed at [facility], [address] [place], [place] [address]     Labs: CBC: Recent Labs  Lab  10/14/21 0236 10/15/21 0417 10/16/21 0432 10/17/21 0541  WBC 19.1* 12.2* 12.2* 13.2*  NEUTROABS 16.5*  --   --   --   HGB 10.6* 9.1* 9.9* 9.7*  HCT 33.1* 26.4* 30.1* 30.0*  MCV 84.0 80.2 82.2 83.8  PLT 303 262 289 299   Basic Metabolic Panel: Recent Labs  Lab 10/14/21 2352 10/15/21 0407 10/15/21 0417 10/16/21 0432 10/17/21 0541 10/18/21 0617  NA 142 142  --  144 144 141  K 3.8 3.8  --  4.1 4.6 4.4  CL 110 111  --  116* 116* 113*  CO2 23 22  --  23 25 22   GLUCOSE 128* 88  --  88 93 90  BUN 75* 68*  --  41* 26* 15  CREATININE 3.78* 3.16*  --  1.76* 1.30* 1.12*  CALCIUM 8.2* 8.1*  --  8.1* 8.3* 8.6*  MG  --   --  2.3 1.9 1.9 1.7  PHOS  --   --  3.1 1.8* 1.9* 2.2*   Liver Function Tests: Recent Labs  Lab 10/14/21 0236 10/15/21 0417  AST 84* 85*  ALT 46* 50*  ALKPHOS 91 69  BILITOT 0.9 0.7  PROT 6.5 5.6*  ALBUMIN 2.9* 2.2*   CBG: Recent Labs  Lab 10/16/21 1212 10/16/21 1626 10/16/21 2019 10/17/21 0012 10/17/21 0402  GLUCAP 109* 90 114* 92 85    Discharge time spent: greater than 30 minutes.  Signed: 10/19/21, MD [facility] 10/18/2021

## 2021-10-18 NOTE — Consult Note (Signed)
PHARMACY CONSULT NOTE  Pharmacy Consult for Electrolyte Monitoring and Replacement   Recent Labs: Potassium (mmol/L)  Date Value  10/18/2021 4.4   Magnesium (mg/dL)  Date Value  76/22/6333 1.7   Calcium (mg/dL)  Date Value  54/56/2563 8.6 (L)   Albumin (g/dL)  Date Value  89/37/3428 2.2 (L)   Phosphorus (mg/dL)  Date Value  76/81/1572 2.2 (L)   Sodium (mmol/L)  Date Value  10/18/2021 141   Assessment: Patient is a 56 y/o F with medical history including CKD, HTN, neuropathy who is admitted with acute renal failure, rhabdomyolysis, and hypotension. Patient is currently admitted to the ICU. Pharmacy consulted to assist with electrolyte monitoring and replacement as indicated. Transferred to floor  MIVF: Acidosis improved after sodium bicarb. Now on 9% NaCl at 75 mL/hr.  Neph note 9/12: plan IVF for at least another 24 hrs  Goal of Therapy:  Electrolytes within normal limits  Plan:  Phos 2.2. Mg 1.7.  MD ordered Mag sulfate 2g IV x1 and Neutraphos 500mg  x2. BMP with AM labs   Clinical Pharmacist 10/18/2021 8:32 AM

## 2021-10-18 NOTE — TOC Progression Note (Signed)
Transition of Care [facility]) - Progression Note    Patient Details  Name: Karen Ray MRN: [id number] Date of Birth: [birth year]  Transition of Care [facility]) CM/SW Contact  Marlowe Sax, RN Phone Number: 10/18/2021, 10:24 AM  Clinical Narrative:     The patient will go to room 505B at [facility] rehab, I spoke with her son Link Snuffer and made him aware, The nurse is to call report to [phone], EMS called to arrange transport, ems papers on chart  Expected Discharge Plan: Home/Self Care Barriers to Discharge: Continued Medical Work up  Expected Discharge Plan and Services Expected Discharge Plan: Home/Self Care   Discharge Planning Services: CM Consult Post Acute Care Choice: Skilled Nursing Facility Living arrangements for the past 2 months: Apartment Expected Discharge Date: 10/18/21               DME Arranged: N/A DME Agency: NA       HH Arranged: NA HH Agency: NA         Social Determinants of Health (SDOH) Interventions    Readmission Risk Interventions     No data to display

## 2021-10-18 NOTE — Progress Notes (Signed)
Nutrition Follow-up  DOCUMENTATION CODES:   Morbid obesity  INTERVENTION:   -D/c Ensure Enlive --1 packet Juven BID, each packet provides 95 calories, 2.5 grams of protein (collagen), and 9.8 grams of carbohydrate (3 grams sugar); also contains 7 grams of L-arginine and L-glutamine, 300 mg vitamin C, 15 mg vitamin E, 1.2 mcg vitamin B-12, 9.5 mg zinc, 200 mg calcium, and 1.5 g  Calcium Beta-hydroxy-Beta-methylbutyrate to support wound healing  -Double protein portions with meals -100 mg vitamin B-1 BID -10,000 IU vitamin A daily - 400 units vitamin E daily -500 mg vitamin C BID -220 mg zinc sulfate daily -25 mg vitamin B6 daily  NUTRITION DIAGNOSIS:   Increased nutrient needs related to wound healing as evidenced by estimated needs.  Ongoing  GOAL:   Patient will meet greater than or equal to 90% of their needs  Progressing   MONITOR:   PO intake, Supplement acceptance, Labs, Weight trends, Skin, I & O's  REASON FOR ASSESSMENT:   Consult Assessment of nutrition requirement/status  ASSESSMENT:   56 y/o female with h/o HTN, CKD III, neuropathy and falls who is admitted with falls, rhabdomyolysis and AKI.  Reviewed I/O's: -1.9 L x 24 hours and -2.2 L since admission  UOP: 2.2 L x 24 hours  Pt unavailable at time of visit. Attempted to speak with pt via call to hospital room phone, however, unable to reach. RD unable to obtain further nutrition-related history or complete nutrition-focused physical exam at this time.     Pt currently on a regular diet. Noted meal completions 50-100%. Noted pt is refusing Ensure supplements; stating she does not want them.   Per MD notes, potential for discharge today.   Reviewed labs for potential deficiencies: zinc WDL. Vitamin B1 (63.9), vitamin A (9), vitamin E (6.9), zinc (40), vitamin B6 (3.3). No vitamin D has been resulted.   Medications reviewed and include vitamin C, zinc, magnesium oxide, MVI, phosphorus, and, magnesium  sulfate.   Labs reviewed: CBGS: 84-114 (inpatient orders for glycemic control are none).    Diet Order:   Diet Order             Diet regular Room service appropriate? Yes; Fluid consistency: Thin  Diet effective now                   EDUCATION NEEDS:   Education needs have been addressed  Skin:  Skin Assessment: Reviewed RN Assessment (bilateral buttocks  11 cm x 8 cm, right upper, posterior thigh MASD-IAD area measures 10 cm x 13 cm, left upper, posterior thigh MASD-IAD area measures 12 cm x 10 cm.)  Last BM:  9/11- type 5  Height:   Ht Readings from Last 1 Encounters:  10/14/21 5\' 3"  (1.6 m)    Weight:   Wt Readings from Last 1 Encounters:  10/15/21 106.8 kg    Ideal Body Weight:  52.2 kg  BMI:  Body mass index is 41.71 kg/m.  Estimated Nutritional Needs:   Kcal:  2000-2300kcal/day  Protein:  100-115g/day  Fluid:  1.6-1.8L/day    12/15/21, RD, LDN, CDCES Registered Dietitian II Certified Diabetes Care and Education Specialist Please refer to [facility] for RD and/or RD on-call/weekend/after hours pager

## 2021-10-19 LAB — CULTURE, BLOOD (ROUTINE X 2)
Culture: NO GROWTH
Culture: NO GROWTH

## 2021-10-21 ENCOUNTER — Encounter: Payer: Self-pay | Admitting: Dietician

## 2021-10-21 LAB — HEPATITIS PANEL, ACUTE: Hep A IgM: NEGATIVE — AB

## 2021-10-21 NOTE — Progress Notes (Signed)
Nutrition Brief Note   Pt had vitamin labs sent out while hospitalized. Results are as follows. Supplementation recommendations were placed on discharge instructions as pt discharged to rehab facility. RD followed up with Neurology at [facility]. Neurology to follow up with vitamin supplementation and monitoring.      Recommend:  -Vitamin B-complex with C po daily x 30 days -Thiamine 100mg  po daily x 30 days -Vitamin A 10,000 units po daily x 30 days -Vitamin C 500mg  po BID x 30 days -Vitamin E 400 units po daily x 30 days -Zinc 50mg  po daily x 30 days  Recommend rechecking vitamin labs within 30-60 days after completion of supplementation. Recommend checking vitamin D level to r/o deficiency.   Koleen Distance MS, RD, LDN Please refer to [facility] for RD and/or RD on-call/weekend/after hours pager
